# Patient Record
Sex: Male | Born: 1945 | Race: White | Hispanic: No | Marital: Married | Smoking: Former smoker
Health system: Southern US, Community
[De-identification: ages and names within clinical notes are randomized; demographics above are authoritative.]

## PROBLEM LIST (undated history)

## (undated) DIAGNOSIS — M1990 Unspecified osteoarthritis, unspecified site: Secondary | ICD-10-CM

## (undated) DIAGNOSIS — C61 Malignant neoplasm of prostate: Secondary | ICD-10-CM

## (undated) DIAGNOSIS — I10 Essential (primary) hypertension: Secondary | ICD-10-CM

## (undated) DIAGNOSIS — N400 Enlarged prostate without lower urinary tract symptoms: Secondary | ICD-10-CM

## (undated) DIAGNOSIS — E119 Type 2 diabetes mellitus without complications: Secondary | ICD-10-CM

## (undated) DIAGNOSIS — N410 Acute prostatitis: Secondary | ICD-10-CM

---

## 1980-05-27 HISTORY — PX: OTHER SURGICAL HISTORY: SHX169

## 2015-05-28 DIAGNOSIS — M1990 Unspecified osteoarthritis, unspecified site: Secondary | ICD-10-CM

## 2015-05-28 DIAGNOSIS — N400 Enlarged prostate without lower urinary tract symptoms: Secondary | ICD-10-CM

## 2015-05-28 HISTORY — DX: Unspecified osteoarthritis, unspecified site: M19.90

## 2015-05-28 HISTORY — DX: Benign prostatic hyperplasia without lower urinary tract symptoms: N40.0

## 2015-08-26 DIAGNOSIS — N410 Acute prostatitis: Secondary | ICD-10-CM

## 2015-08-26 HISTORY — DX: Acute prostatitis: N41.0

## 2016-01-05 ENCOUNTER — Other Ambulatory Visit: Payer: Self-pay | Admitting: Urology

## 2016-02-20 HISTORY — PX: PROSTATE BIOPSY: SHX241

## 2016-02-23 ENCOUNTER — Encounter (HOSPITAL_BASED_OUTPATIENT_CLINIC_OR_DEPARTMENT_OTHER): Payer: Self-pay | Admitting: *Deleted

## 2016-02-23 NOTE — Progress Notes (Signed)
To [facility] at 0600- Istat,Ekg on arrival-instructed Npo after [place]-will take lansoprazole,amlodipine,metoprolol with small water in am.

## 2016-02-29 ENCOUNTER — Ambulatory Visit (HOSPITAL_BASED_OUTPATIENT_CLINIC_OR_DEPARTMENT_OTHER): Admission: RE | Admit: 2016-02-29 | Payer: Self-pay | Source: Ambulatory Visit | Admitting: Urology

## 2016-02-29 HISTORY — DX: Acute prostatitis: N41.0

## 2016-02-29 HISTORY — DX: Benign prostatic hyperplasia without lower urinary tract symptoms: N40.0

## 2016-02-29 HISTORY — DX: Unspecified osteoarthritis, unspecified site: M19.90

## 2016-02-29 HISTORY — DX: Essential (primary) hypertension: I10

## 2016-02-29 SURGERY — CYSTOSCOPY WITH INSERTION OF UROLIFT
Anesthesia: General

## 2016-05-27 HISTORY — PX: HIP ARTHROPLASTY: SHX981

## 2017-05-27 HISTORY — PX: KNEE ARTHROPLASTY: SHX992

## 2017-07-10 ENCOUNTER — Encounter: Payer: Self-pay | Admitting: Radiation Oncology

## 2017-07-10 ENCOUNTER — Other Ambulatory Visit (HOSPITAL_COMMUNITY): Payer: Self-pay | Admitting: Urology

## 2017-07-10 DIAGNOSIS — C61 Malignant neoplasm of prostate: Secondary | ICD-10-CM

## 2017-07-31 ENCOUNTER — Encounter (HOSPITAL_COMMUNITY)
Admission: RE | Admit: 2017-07-31 | Discharge: 2017-07-31 | Disposition: A | Discharge status: Home / Self Care | Payer: Medicare Other | Source: Ambulatory Visit | Attending: Urology | Admitting: Urology

## 2017-07-31 DIAGNOSIS — R9389 Abnormal findings on diagnostic imaging of other specified body structures: Secondary | ICD-10-CM | POA: Insufficient documentation

## 2017-07-31 DIAGNOSIS — C61 Malignant neoplasm of prostate: Secondary | ICD-10-CM | POA: Insufficient documentation

## 2017-07-31 DIAGNOSIS — R937 Abnormal findings on diagnostic imaging of other parts of musculoskeletal system: Secondary | ICD-10-CM | POA: Insufficient documentation

## 2017-07-31 MED ORDER — TECHNETIUM TC 99M MEDRONATE IV KIT
22.0000 | PACK | Freq: Once | INTRAVENOUS | Status: AC | PRN
  Administered 2017-07-31: 22 via INTRAVENOUS

## 2017-08-01 ENCOUNTER — Encounter: Payer: Self-pay | Admitting: Radiation Oncology

## 2017-08-01 NOTE — Progress Notes (Signed)
GU Location of Tumor / Histology: prostatic adenocarcinoma  If Prostate Cancer, Gleason Score is (3 + 4) and PSA is (9.47) on 05/2017. Prostate volume: 57 grams.  Alex Randolph was diagnosed on 02/21/2016 with prostate cancer. He has been under active surveillance since.  Repeat prostate biopsy:   Past/Anticipated interventions by urology, if any: prostate biopsy x 3, active surveillance, bone scan (negative), ordered CT abdomen/pelvis (can't find where this was done), referral to radiation oncology  Past/Anticipated interventions by medical oncology, if any: no  Weight changes, if any: no  Bowel/Bladder complaints, if any: IPSS 19. Reports occasional urinary leakage associated with urgency. Reports he has to strain at night to void. Reports taking tamsulosin once daily. Reports a strong stream without difficulty emptying his bladder. Reports nocturia x 3-4. Denies dysuria or hematuria.   Nausea/Vomiting, if any: no  Pain issues, if any:  Denies pain in left hip at rest. Reports pain in left hip with ambulation. Planning hip surgery in May.  SAFETY ISSUES:  Prior radiation? no  Pacemaker/ICD? no  Possible current pregnancy? no  Is the patient on methotrexate? no  Current Complaints / other details:  72 year old male. Married. Retired. Resides in [place], [place] (45 minutes away).

## 2017-08-04 ENCOUNTER — Ambulatory Visit
Admission: RE | Admit: 2017-08-04 | Discharge: 2017-08-04 | Disposition: A | Discharge status: Home / Self Care | Payer: Medicare Other | Source: Ambulatory Visit | Attending: Radiation Oncology | Admitting: Radiation Oncology

## 2017-08-04 ENCOUNTER — Encounter: Payer: Self-pay | Admitting: Medical Oncology

## 2017-08-04 ENCOUNTER — Other Ambulatory Visit: Payer: Self-pay

## 2017-08-04 ENCOUNTER — Encounter: Payer: Self-pay | Admitting: Radiation Oncology

## 2017-08-04 VITALS — BP 129/62 | HR 66 | Temp 98.3°F | Resp 18 | Ht 71.0 in | Wt 238.2 lb

## 2017-08-04 DIAGNOSIS — Z803 Family history of malignant neoplasm of breast: Secondary | ICD-10-CM | POA: Insufficient documentation

## 2017-08-04 DIAGNOSIS — I10 Essential (primary) hypertension: Secondary | ICD-10-CM | POA: Insufficient documentation

## 2017-08-04 DIAGNOSIS — Z87891 Personal history of nicotine dependence: Secondary | ICD-10-CM | POA: Diagnosis not present

## 2017-08-04 DIAGNOSIS — Z9889 Other specified postprocedural states: Secondary | ICD-10-CM | POA: Insufficient documentation

## 2017-08-04 DIAGNOSIS — Z7984 Long term (current) use of oral hypoglycemic drugs: Secondary | ICD-10-CM | POA: Diagnosis not present

## 2017-08-04 DIAGNOSIS — Z808 Family history of malignant neoplasm of other organs or systems: Secondary | ICD-10-CM | POA: Diagnosis not present

## 2017-08-04 DIAGNOSIS — C61 Malignant neoplasm of prostate: Secondary | ICD-10-CM | POA: Insufficient documentation

## 2017-08-04 DIAGNOSIS — Z880 Allergy status to penicillin: Secondary | ICD-10-CM | POA: Diagnosis not present

## 2017-08-04 DIAGNOSIS — Z79899 Other long term (current) drug therapy: Secondary | ICD-10-CM | POA: Insufficient documentation

## 2017-08-04 HISTORY — DX: Malignant neoplasm of prostate: C61

## 2017-08-04 NOTE — Progress Notes (Signed)
See progress note under physician encounter. 

## 2017-08-04 NOTE — Progress Notes (Signed)
Radiation Oncology         [phone] ________________________________  Initial Outpatient Consultation  Name: Alex Randolph MRN: [id number]  Date: 08/04/2017  DOB: [birth year]  CC:Macon Large, MD  Davis Gourd*   REFERRING PHYSICIAN: Davis Gourd*  DIAGNOSIS:  72 y.o. gentleman with Stage T1c adenocarcinoma of the prostate with Gleason Score of 3+4, and PSA of 9.47    ICD-10-CM   1. Malignant neoplasm of prostate ([place]) C61     HISTORY OF PRESENT ILLNESS: Alex Randolph is a 72 y.o. male with a diagnosis of prostate cancer, originally diagnosed on 02/21/2016 with Gleason 6 in 2/12 cores and a PSA of 6.4. He has been under active surveillance since. 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 More recently, he was noted to have a PSA of 9.47 in January 2019, up significantly from 5.4 in August 2018.  Accordingly, he was referred for evaluation in urology by Dr. Lovena Neighbours on 07/03/2017, where a digital rectal examination was performed at that time revealing no prostate nodularity. The patient proceeded to transrectal ultrasound with 12 biopsies of the prostate on 07/03/2017.  The prostate volume measured 57 cc.  Out of 12 core biopsies, 4 were positive.  The maximum Gleason score was 3+4, and this was seen in the left mid.  Repeat prostate biopsy:     The patient reviewed the biopsy results with his urologist and he has kindly been referred today for discussion of potential radiation treatment options. He underwent CT abdomen and pelvis and bone scan on 07/31/17 that did reveal concerns for disease.   PREVIOUS RADIATION THERAPY: No  PAST MEDICAL HISTORY:  Past Medical History:  Diagnosis Date  . Arthritis 2017   knees,hips,thumbs  . BPH (benign prostatic hyperplasia) 2017  . Hypertension    controlled with medication  . Prostate cancer ([place])   . Prostatitis, acute 08/2015   in hospital w/fever-diagnosis made in [place]: Past Surgical  History:  Procedure Laterality Date  . bone spur removal  1982  . PROSTATE BIOPSY  02/20/2016   in urologist office along with cystoscopy    FAMILY HISTORY:  Family History  Problem Relation Age of Onset  . Cancer Sister        breast  . Cancer Maternal Grandfather        prostate cancer ?  Marland Kitchen Cancer Daughter        hodgkins    SOCIAL HISTORY:  Social History   Socioeconomic History  . Marital status: Married    Spouse name: Not on file  . Number of children: 1  . Years of education: Not on file  . Highest education level: Not on file  Social Needs  . Financial resource strain: Not on file  . Food insecurity - worry: Not on file  . Food insecurity - inability: Not on file  . Transportation needs - medical: Not on file  . Transportation needs - non-medical: Not on file  Occupational History    Comment: retired  Tobacco Use  . Smoking status: Former Smoker    Packs/day: 1.00    Types: Cigarettes    Last attempt to quit: 02/23/1976    Years since quitting: 41.4  . Smokeless tobacco: Never Used  Substance and Sexual Activity  . Alcohol use: Yes    Alcohol/week: 1.2 oz    Types: 2 Cans of beer per week  . Drug use: No  . Sexual activity:  Yes  Other Topics Concern  . Not on file  Social History Narrative   Resides in [place], [place] 45 minutes away  The patient is married and lives in [place], [place]. He is retired.  ALLERGIES: Penicillins  MEDICATIONS:  Current Outpatient Medications  Medication Sig Dispense Refill  . amLODipine (NORVASC) 5 MG tablet Take 5 mg by mouth daily.    Marland Kitchen atorvastatin (LIPITOR) 10 MG tablet Take 10 mg by mouth daily.    . lansoprazole (PREVACID) 30 MG capsule Take 30 mg by mouth daily at 12 noon.    Marland Kitchen losartan-hydrochlorothiazide (HYZAAR) 100-12.5 MG tablet Take 1 tablet by mouth daily.    . metFORMIN (GLUCOPHAGE) 500 MG tablet Take by mouth 2 (two) times daily with a meal.    . metoprolol tartrate (LOPRESSOR) 25 MG tablet Take 25 mg by  mouth daily.    . pregabalin (LYRICA) 100 MG capsule Take 100 mg by mouth 2 (two) times daily.    . tamsulosin (FLOMAX) 0.4 MG CAPS capsule Take 0.4 mg by mouth daily after supper.    . traMADol (ULTRAM) 50 MG tablet Take by mouth every 6 (six) hours as needed.     No current facility-administered medications for this encounter.     REVIEW OF SYSTEMS:  On review of systems, the patient reports that he is doing well overall. He denies any chest pain, shortness of breath, cough, fevers, chills, night sweats, or unintended weight changes. He denies any bowel disturbances, and denies abdominal pain, nausea or vomiting. He reports severe left hip pain with ambulation but denies pain in left hip at rest. His IPSS was 19, indicating moderate urinary symptoms of urgency with occasional leakage, nocturia x4 with straining, frequency, intermittency, and weak stream. He is managing his urinary symptoms with tamsulosin. He denies any dysuria or hematuria. He is able to complete sexual activity with all attempts. A complete review of systems is obtained and is otherwise negative.    PHYSICAL EXAM:  Wt Readings from Last 3 Encounters:  08/04/17 238 lb 3.2 oz (108 kg)   Temp Readings from Last 3 Encounters:  08/04/17 98.3 F (36.8 C) (Oral)   BP Readings from Last 3 Encounters:  08/04/17 129/62   Pulse Readings from Last 3 Encounters:  08/04/17 66   Pain Assessment Pain Score: 0-No pain/10  In general this is a well appearing caucasian male in no acute distress. He is alert and oriented x4 and appropriate throughout the examination. HEENT reveals that the patient is normocephalic, atraumatic. EOMs are intact. Skin is intact without any evidence of gross lesions.Cardiopulmonary assessment is negative for acute distress and he exhibits normal effort.   KPS = 100  100 - Normal; no complaints; no evidence of disease. 90   - Able to carry on normal activity; minor signs or symptoms of disease. 80   -  Normal activity with effort; some signs or symptoms of disease. 68   - Cares for self; unable to carry on normal activity or to do active work. 60   - Requires occasional assistance, but is able to care for most of his personal needs. 50   - Requires considerable assistance and frequent medical care. 91   - Disabled; requires special care and assistance. 18   - Severely disabled; hospital admission is indicated although death not imminent. 60   - Very sick; hospital admission necessary; active supportive treatment necessary. 10   - Moribund; fatal processes progressing rapidly. 0     -  Dead  Karnofsky DA, Abelmann WH, Craver LS and Burchenal [facility] [phone]) The use of the nitrogen mustards in the palliative treatment of carcinoma: with particular reference to bronchogenic carcinoma Cancer 1 634-56  LABORATORY DATA:  No results found for: WBC, HGB, HCT, MCV, PLT No results found for: NA, K, CL, CO2 No results found for: ALT, AST, GGT, ALKPHOS, BILITOT   RADIOGRAPHY: Nm Bone Scan Whole Body  Result Date: 07/31/2017 CLINICAL DATA:  72 year old with current history of prostate cancer with elevated PSA of 9.47 (06/09/2017). Patient complains of severe left hip pain though states he has arthritis. No prior trauma. EXAM: NUCLEAR MEDICINE WHOLE BODY BONE SCAN TECHNIQUE: Whole body anterior and posterior images were obtained approximately 3 hours after intravenous injection of radiopharmaceutical. RADIOPHARMACEUTICALS:  22 mCi Technetium-44m MDP IV COMPARISON:  None. FINDINGS: Marked increased activity in the left acetabulum and in the left femoral head. Scattered increased activity involving the thoracic and lumbar spine. Increased activity involving the medial compartments of both knees, involving both the femur and tibia, right greater than left. Increased activity localizing to the right midfoot and to the first MTP joints bilaterally. Marked activity in the anterior neck which are presumably is related to  thyroid uptake of free pertechnetate. The thyroid gland therefore is likely enlarged. Physiologic excretion into the urinary tract. IMPRESSION: 1. 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 2. Likely degenerative activity involving the thoracic and lumbar spine. 3. Probable thyroid gland enlargement, as there is a large amount of activity in the anterior neck due to thyroid uptake of free pertechnetate. RECOMMENDATION: If not already performed elsewhere, x-rays of the left hip, both knees, and the thoracic and lumbar spine is recommended. Electronically Signed   By: Evangeline Dakin M.D.   On: 07/31/2017 17:35      IMPRESSION/PLAN: 1. 72 y.o. gentleman with Stage T1c adenocarcinoma of the prostate with Gleason Score of 3+4, and PSA of 9.47. We reviewed the patient's history and course. We discussed his T staging, Gleason score, and PSA as to how this determines treatment options. We reviewed that he is a candidate for external beam radiotherapy versus seed implant approach. However after reviewing his IPSS score, his current need for Flomax, and his gland volume put him at high risk of outflow urinary obstruction. He discusses that there are also risks of needing intermittent catheterizations. We discussed the risks, benefits, short, and long term effects of radiotherapy, and the patient is interested in proceeding in external beam radiotherapy closer to home in [place], [place]. We discussed the delivery and logistics of radiotherapy and anticipates a course of 8 weeks of treatment if he proceeds with external beam treatment. He is interested in this and we will coordinate referral to Radiation Oncology in [place], [place].  In a visit lasting 45 minutes, greater than 50% of the time was spent face to face discussing his case, and coordinating the patient's care.     Carola Rhine,  PAC    Tyler Pita, MD  [facility] Direct Dial: [phone]  Fax: [phone] [email or web address]  Skype  LinkedIn   This document serves as a record of services personally performed by Tyler Pita, MD and Shona Simpson, PA-C. It was created on their behalf by Rae Lips, a trained medical scribe. The creation of this record is based on the scribe's personal observations and the providers' statements to them.  This document has been checked and approved by the attending providers.

## 2017-08-04 NOTE — Progress Notes (Signed)
Introduced myself to Alex Randolph and his wife as the prostate nurse navigator and my role. He is interested in seed implant but we discussed that the prostate gland size and his urinary score will be factors in this treatment choice. He lives in [place] so if he does 8 weeks of radiation he would like to get treated closer to home. I informed him that Dr. Tammi Klippel can refer him. I gave them my business card and asked they call with questions and or concerns.

## 2017-08-07 ENCOUNTER — Telehealth: Payer: Self-pay | Admitting: *Deleted

## 2017-08-07 NOTE — Telephone Encounter (Signed)
CALLED PATIENT TO INFORM OF APPT. WITH DR. Kristen Loader, [place]. ON 08-26-17 - ARRIVAL TIME - 10 AM , [address], [address] - [phone], SPOKE WITH PATIENT AND HE IS AWARE OF THIS APPT.

## 2018-10-22 IMAGING — NM NM BONE WHOLE BODY
2 series · 2 of 2 positions shown · non-contrast
Comparison: None.

CLINICAL DATA: 71-year-old with current history of prostate cancer
with elevated PSA of 9.47 (06/09/2017). Patient complains of severe
left hip pain though states he has arthritis. No prior trauma.

EXAM:
NUCLEAR MEDICINE WHOLE BODY BONE SCAN
TECHNIQUE: Whole body anterior and posterior images were obtained approximately
3 hours after intravenous injection of radiopharmaceutical.
RADIOPHARMACEUTICALS:  22 mCi Fechnetium-66m MDP IV

[Series 1: wbr_bone_40 whole body · 2.66mm/px · 1 of 1 slices shown (1 of 2)]
[im 1/1]
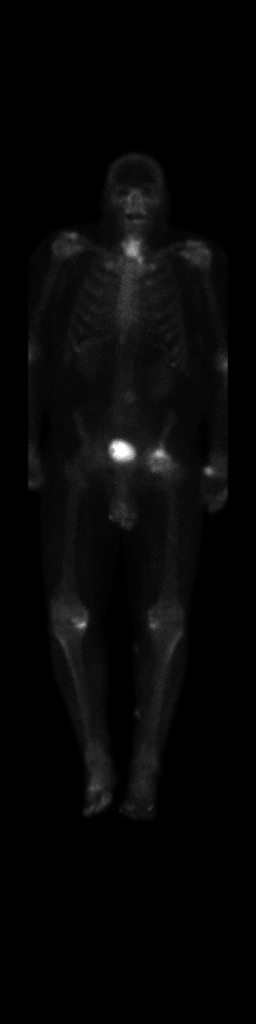

[Series 1: wbr_bone_40 whole body · 2.66mm/px · 1 of 1 slices shown (2 of 2)]
[im 1/1]
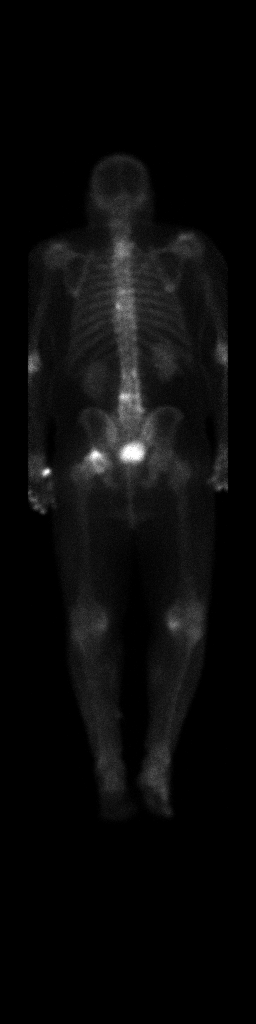

[2 of 2 positions shown; findings below may reference images not displayed]

FINDINGS: Marked increased activity in the left acetabulum and in the left
femoral head. Scattered increased activity involving the thoracic
and lumbar spine. Increased activity involving the medial
compartments of both knees, involving both the femur and tibia,
right greater than left. Increased activity localizing to the right
midfoot and to the first MTP joints bilaterally.

Marked activity in the anterior neck which are presumably is related
to thyroid uptake of free pertechnetate. The thyroid gland therefore
is likely enlarged. Physiologic excretion into the urinary tract.
IMPRESSION: 1. Findings which most likely indicate osteoarthritis involving the
left hip, the medial compartments of both knees, the right midfoot,
and the first MTP joints bilaterally.
2. Likely degenerative activity involving the thoracic and lumbar
spine.
3. Probable thyroid gland enlargement, as there is a large amount of
activity in the anterior neck due to thyroid uptake of free
pertechnetate.

RECOMMENDATION:

If not already performed elsewhere, x-rays of the left hip, both
knees, and the thoracic and lumbar spine is recommended.

## 2019-04-19 ENCOUNTER — Encounter: Payer: Self-pay | Admitting: *Deleted

## 2022-07-03 NOTE — Patient Instructions (Addendum)
DUE TO COVID-19 ONLY TWO VISITORS  (aged 78 and older)  ARE ALLOWED TO COME WITH YOU AND STAY IN THE WAITING ROOM ONLY DURING PRE OP AND PROCEDURE.   **NO VISITORS ARE ALLOWED IN THE SHORT STAY AREA OR RECOVERY ROOM!!**  IF YOU WILL BE ADMITTED INTO THE HOSPITAL YOU ARE ALLOWED ONLY FOUR SUPPORT PEOPLE DURING VISITATION HOURS ONLY (7 AM -8PM)   The support person(s) must pass our screening, gel in and out, and wear a mask at all times, including in the patient's room. Patients must also wear a mask when staff or their support person are in the room. Visitors GUEST BADGE MUST BE WORN VISIBLY  One adult visitor may remain with you overnight and MUST be in the room by 8 P.M.     Your procedure is scheduled on: 07/15/22   Report to [facility] Main Entrance    Report to admitting at 12:15 PM   Call this number if you have problems the morning of surgery [phone]   Do not eat food :After Midnight.   After Midnight you may have the following liquids until _11:45_AM/  DAY OF SURGERY  Water Black Coffee (sugar ok, NO MILK/CREAM OR CREAMERS)  Tea (sugar ok, NO MILK/CREAM OR CREAMERS) regular and decaf                             Plain Jell-O (NO RED)                                           Fruit ices (not with fruit pulp, NO RED)                                     Popsicles (NO RED)                                                                  Juice: apple, WHITE grape, WHITE cranberry Sports drinks like Gatorade (NO RED)                    The day of surgery:  Drink ONE (1) G2 at  11:15 AM the morning of surgery. Drink in one sitting. Do not sip.  This drink was given to you during your hospital  pre-op appointment visit. Nothing else to drink after completing the   G2. At 11:45 AM          If you have questions, please contact your surgeon's office.       Oral Hygiene is also important to reduce your risk of infection.                                     Remember - BRUSH YOUR TEETH THE MORNING OF SURGERY WITH YOUR REGULAR TOOTHPASTE  DENTURES WILL BE REMOVED PRIOR TO SURGERY PLEASE DO NOT APPLY "Poly grip" OR ADHESIVES!!!   Do NOT smoke after Midnight   Take these medicines  the morning of surgery with A SIP OF WATER:  Lyrica                                                                                                                            Atorvastatin                                                                                                                            Metoprolol                                                                                                                            Amlodipine                                                                                                                            Tamsulosin  Iansoprazole-Pepcid  DO NOT TAKE ANY ORAL DIABETIC MEDICATIONS DAY OF YOUR SURGERY (Metformin)  Bring CPAP mask and tubing day of surgery.                              You may not have any metal on your body including , jewelry, and body piercing             Do not wear  lotions, powders, cologne, or deodorant              Men may shave face and neck.   Do not bring valuables to the hospital. [place].   Contacts, glasses, or bridgework may not be worn into surgery.   Bring small overnight bag day of surgery.   DO NOT [place]. PHARMACY WILL DISPENSE MEDICATIONS LISTED ON YOUR MEDICATION LIST TO YOU DURING YOUR ADMISSION [facility]!       Special Instructions: Bring a copy of your healthcare power of attorney and living will documents  the day of surgery if you haven't scanned them before.              Please read over the following fact sheets you were given: IF YOU  HAVE QUESTIONS ABOUT YOUR PRE-OP INSTRUCTIONS PLEASE CALL [phone]    [facility] - Preparing for Surgery Before surgery, you can play an important role.  Because skin is not sterile, your skin needs to be as free of germs as possible.  You can reduce the number of germs on your skin by washing with CHG (chlorahexidine gluconate) soap before surgery.  CHG is an antiseptic cleaner which kills germs and bonds with the skin to continue killing germs even after washing. Please DO NOT use if you have an allergy to CHG or antibacterial soaps.  If your skin becomes reddened/irritated stop using the CHG and inform your nurse when you arrive at Short Stay.  You may shave your face/neck. Please follow these instructions carefully:  1.  Shower with CHG Soap the night before surgery and the  morning of Surgery.  2.  If you choose to wash your hair, wash your hair first as usual with your  normal  shampoo.  3.  After you shampoo, rinse your hair and body thoroughly to remove the  shampoo.                            4.  Use CHG as you would any other liquid soap.  You can apply chg directly  to the skin and wash                       Gently with a scrungie or clean washcloth.  5.  Apply the CHG Soap to your body ONLY FROM THE NECK DOWN.   Do not use on face/ open                           Wound or open sores. Avoid contact with eyes, ears mouth and genitals (private parts).                       Wash face,  Genitals (private parts) with  your normal soap.             6.  Wash thoroughly, paying special attention to the area where your surgery  will be performed.  7.  Thoroughly rinse your body with warm water from the neck down.  8.  DO NOT shower/wash with your normal soap after using and rinsing off  the CHG Soap.                9.  Pat yourself dry with a clean towel.            10.  Wear clean pajamas.            11.  Place clean sheets on your bed the night of your first shower and do not  sleep with  pets. Day of Surgery : Do not apply any lotions/deodorants the morning of surgery.  Please wear clean clothes to the hospital/surgery center.  FAILURE TO FOLLOW THESE INSTRUCTIONS MAY RESULT IN THE CANCELLATION OF YOUR SURGERY  ________________________________________________________________________  Incentive Spirometer  An incentive spirometer is a tool that can help keep your lungs clear and active. This tool measures how well you are filling your lungs with each breath. Taking long deep breaths may help reverse or decrease the chance of developing breathing (pulmonary) problems (especially infection) following: A long period of time when you are unable to move or be active. BEFORE THE PROCEDURE  If the spirometer includes an indicator to show your best effort, your nurse or respiratory therapist will set it to a desired goal. If possible, sit up straight or lean slightly forward. Try not to slouch. Hold the incentive spirometer in an upright position. INSTRUCTIONS FOR USE  Sit on the edge of your bed if possible, or sit up as far as you can in bed or on a chair. Hold the incentive spirometer in an upright position. Breathe out normally. Place the mouthpiece in your mouth and seal your lips tightly around it. Breathe in slowly and as deeply as possible, raising the piston or the ball toward the top of the column. Hold your breath for 3-5 seconds or for as long as possible. Allow the piston or ball to fall to the bottom of the column. Remove the mouthpiece from your mouth and breathe out normally. Rest for a few seconds and repeat Steps 1 through 7 at least 10 times every 1-2 hours when you are awake. Take your time and take a few normal breaths between deep breaths. The spirometer may include an indicator to show your best effort. Use the indicator as a goal to work toward during each repetition. After each set of 10 deep breaths, practice coughing to be sure your lungs are clear. If you  have an incision (the cut made at the time of surgery), support your incision when coughing by placing a pillow or rolled up towels firmly against it. Once you are able to get out of bed, walk around indoors and cough well. You may stop using the incentive spirometer when instructed by your caregiver.  RISKS AND COMPLICATIONS Take your time so you do not get dizzy or light-headed. If you are in pain, you may need to take or ask for pain medication before doing incentive spirometry. It is harder to take a deep breath if you are having pain. AFTER USE Rest and breathe slowly and easily. It can be helpful to keep track of a log of your progress. Your caregiver can provide you with a  simple table to help with this. If you are using the spirometer at home, follow these instructions: [place] IF:  You are having difficultly using the spirometer. You have trouble using the spirometer as often as instructed. Your pain medication is not giving enough relief while using the spirometer. You develop fever of 100.5 F (38.1 C) or higher. SEEK IMMEDIATE MEDICAL CARE IF:  You cough up bloody sputum that had not been present before. You develop fever of 102 F (38.9 C) or greater. You develop worsening pain at or near the incision site. MAKE SURE YOU:  Understand these instructions. Will watch your condition. Will get help right away if you are not doing well or get worse. Document Released: 09/23/2006 Document Revised: 08/05/2011 Document Reviewed: 11/24/2006 [facility] Patient Information 2014 [place], [place].   ________________________________________________________________________

## 2022-07-04 ENCOUNTER — Encounter (HOSPITAL_COMMUNITY)
Admission: RE | Admit: 2022-07-04 | Discharge: 2022-07-04 | Disposition: A | Discharge status: Home / Self Care | Payer: Medicare Other | Source: Ambulatory Visit | Attending: Orthopedic Surgery | Admitting: Orthopedic Surgery

## 2022-07-04 ENCOUNTER — Encounter (HOSPITAL_COMMUNITY): Payer: Self-pay

## 2022-07-04 ENCOUNTER — Other Ambulatory Visit: Payer: Self-pay

## 2022-07-04 DIAGNOSIS — Z01818 Encounter for other preprocedural examination: Secondary | ICD-10-CM | POA: Diagnosis present

## 2022-07-04 DIAGNOSIS — E119 Type 2 diabetes mellitus without complications: Secondary | ICD-10-CM | POA: Insufficient documentation

## 2022-07-04 HISTORY — DX: Type 2 diabetes mellitus without complications: E11.9

## 2022-07-04 LAB — CBC
HCT: 45.9 % (ref 39.0–52.0)
Hemoglobin: 15.8 g/dL (ref 13.0–17.0)
MCH: 33 pg (ref 26.0–34.0)
MCHC: 34.4 g/dL (ref 30.0–36.0)
MCV: 95.8 fL (ref 80.0–100.0)
Platelets: 145 10*3/uL — ABNORMAL LOW (ref 150–400)
RBC: 4.79 MIL/uL (ref 4.22–5.81)
RDW: 12.7 % (ref 11.5–15.5)
WBC: 7.8 10*3/uL (ref 4.0–10.5)
nRBC: 0 % (ref 0.0–0.2)

## 2022-07-04 LAB — TYPE AND SCREEN
ABO/RH(D): O POS
Antibody Screen: NEGATIVE

## 2022-07-04 LAB — BASIC METABOLIC PANEL
Anion gap: 12 (ref 5–15)
BUN: 12 mg/dL (ref 8–23)
CO2: 26 mmol/L (ref 22–32)
Calcium: 9.4 mg/dL (ref 8.9–10.3)
Chloride: 98 mmol/L (ref 98–111)
Creatinine, Ser: 0.78 mg/dL (ref 0.61–1.24)
GFR, Estimated: >60 mL/min (ref >60)
Glucose, Bld: 136 mg/dL — ABNORMAL HIGH (ref 70–99)
Potassium: 4.3 mmol/L (ref 3.5–5.1)
Sodium: 136 mmol/L (ref 135–145)

## 2022-07-04 LAB — HEMOGLOBIN A1C
Hgb A1c MFr Bld: 6.2 % — ABNORMAL HIGH (ref 4.8–5.6)
Mean Plasma Glucose: 131.24 mg/dL

## 2022-07-04 LAB — SURGICAL PCR SCREEN
MRSA, PCR: NEGATIVE
Staphylococcus aureus: NEGATIVE

## 2022-07-04 LAB — GLUCOSE, CAPILLARY: Glucose-Capillary: 147 mg/dL — ABNORMAL HIGH (ref 70–99)

## 2022-07-04 NOTE — Progress Notes (Signed)
Anesthesia note:  Bowel prep reminder:  NA  PCP - Dr. Skip [facility] Cardiologist -none Other-   Chest x-ray - no EKG - 07/04/22 Stress Test - no ECHO - no Cardiac Cath - no CABG-no Pacemaker/ICD device last checked:no  Sleep Study -  CPAP -    CBG at PAT visit-147 Fasting Blood Sugar at home-none Checks Blood Sugar doesn't test_____  Blood Thinner:no Blood Thinner Instructions: Aspirin Instructions: Last Dose:  Anesthesia review: /No    Patient denies shortness of breath, fever, cough and chest pain at PAT appointment. Pt has no SOB with activities. He is using crutches.   Patient verbalized understanding of instructions that were given to them at the PAT appointment. Patient was also instructed that they will need to review over the PAT instructions again at home before surgery.yes his wife was with him

## 2022-07-08 NOTE — H&P (Signed)
TOTAL HIP ADMISSION H&P  Patient is admitted for right total hip arthroplasty.  Subjective:  Chief Complaint: Right hip pain  HPI: Alex Randolph, 77 y.o. male, has a history of pain and functional disability in the right hip due to arthritis and patient has failed non-surgical conservative treatments for greater than 12 weeks to include NSAID's and/or analgesics, corticosteriod injections, use of assistive devices, and activity modification. Onset of symptoms was gradual, starting  several  years ago with gradually worsening course since that time. The patient noted no past surgery on the right hip. Patient currently rates pain in the right hip at 7 out of 10 with activity. Patient has night pain, worsening of pain with activity and weight bearing, and trendelenberg gait. Patient has evidence of  bone-on-bone arthritis in the right hip  by imaging studies. This condition presents safety issues increasing the risk of falls. There is no current active infection.  Patient Active Problem List   Diagnosis Date Noted   Malignant neoplasm of prostate ([place]) 08/04/2017    Past Medical History:  Diagnosis Date   Arthritis 2017   knees,hips,thumbs, neck   BPH (benign prostatic hyperplasia) 2017   radiation   Diabetes mellitus without complication ([place])    Hypertension    controlled with medication   Prostate cancer ([place])    Prostatitis, acute 08/2015   in hospital w/fever-diagnosis made in [place]    Past Surgical History:  Procedure Laterality Date   bone spur removal  1982   neck   HIP ARTHROPLASTY Left 2018   KNEE ARTHROPLASTY Right 2019   PROSTATE BIOPSY  02/20/2016   in urologist office along with cystoscopy    Prior to Admission medications   Medication Sig Start Date End Date Taking? Authorizing Provider  amLODipine (NORVASC) 5 MG tablet Take 5 mg by mouth daily.   Yes [provider]  atorvastatin (LIPITOR) 10 MG tablet Take 10 mg by mouth daily.   Yes [provider]  lansoprazole (PREVACID) 30 MG capsule Take 30 mg by mouth daily at 12 noon.   Yes [provider]  losartan-hydrochlorothiazide (HYZAAR) 100-12.5 MG tablet Take 1 tablet by mouth daily.   Yes [provider]  metFORMIN (GLUCOPHAGE-XR) 500 MG 24 hr tablet Take 500 mg by mouth daily. 02/21/22  Yes [provider]  metoprolol tartrate (LOPRESSOR) 50 MG tablet Take 50 mg by mouth 2 (two) times daily.   Yes [provider]  pregabalin (LYRICA) 100 MG capsule Take 100 mg by mouth 2 (two) times daily.   Yes [provider]  tamsulosin (FLOMAX) 0.4 MG CAPS capsule Take 0.4 mg by mouth daily after supper.   Yes [provider]  triamcinolone (KENALOG) 0.025 % ointment Apply 1 Application topically daily as needed (rash). 02/03/22  Yes [provider]    Allergies  Allergen Reactions   Penicillins Hives    Social History   Socioeconomic History   Marital status: Married    Spouse name: Not on file   Number of children: 1   Years of education: Not on file   Highest education level: Not on file  Occupational History    Comment: retired  Tobacco Use   Smoking status: Former    Packs/day: 1.00    Years: 25.00    Total pack years: 25.00    Types: Cigarettes    Quit date: 02/23/1976    Years since quitting: 46.4   Smokeless tobacco: Never  Vaping Use   Vaping  Use: Never used  Substance and Sexual Activity   Alcohol use: Yes    Alcohol/week: 2.0 standard drinks of alcohol    Types: 2 Cans of beer per week   Drug use: No   Sexual activity: Yes  Other Topics Concern   Not on file  Social History Narrative   Resides in [place], [place] 45 minutes away   Social Determinants of Health   Financial Resource Strain: Not on file  Food Insecurity: Not on file  Transportation Needs: Not on file  Physical Activity: Not on file  Stress: Not on file  Social Connections: Not on file  Intimate Partner Violence: Not on file     Tobacco Use: Medium Risk (07/04/2022)   Patient History    Smoking Tobacco Use: Former    Smokeless Tobacco Use: Never    Passive Exposure: Not on file   Social History   Substance and Sexual Activity  Alcohol Use Yes   Alcohol/week: 2.0 standard drinks of alcohol   Types: 2 Cans of beer per week    Family History  Problem Relation Age of Onset   Cancer Sister        breast   Cancer Maternal Grandfather        prostate cancer ?   Cancer Daughter        hodgkins    Review of Systems  Constitutional:  Negative for chills and fever.  HENT: Negative.    Eyes: Negative.   Respiratory:  Negative for cough and shortness of breath.   Cardiovascular:  Negative for chest pain and palpitations.  Gastrointestinal:  Negative for abdominal pain, constipation, diarrhea, nausea and vomiting.  Genitourinary:  Negative for dysuria, frequency and urgency.  Musculoskeletal:  Positive for joint pain.  Skin:  Negative for rash.   Objective:  Physical Exam: Well nourished and well developed.  General: Alert and oriented x3, cooperative and pleasant, no acute distress.  Head: normocephalic, atraumatic, neck supple.  Eyes: EOMI.  Abdomen: non-tender to palpation and soft, normoactive bowel sounds. Musculoskeletal: The patient has a significantly antalgic gait pattern favoring the right side without the use of crutches.   Right Hip Exam:  The range of motion: Flexion to 100 degrees, Internal Rotation to 10 degrees, External Rotation to 30 degrees, and abduction to 20 degrees with pain on range of motion.  There is no tenderness over the greater trochanteric bursa.   Left Hip Exam:  The range of motion: Flexion to 110 degrees, Internal Rotation to 20 degrees, External Rotation to 30 degrees, and abduction to 30 degrees without discomfort.  There is no tenderness over the greater trochanteric bursa.  Calves soft and nontender. Motor function intact in LE. Strength 5/5 LE  bilaterally. Neuro: Distal pulses 2+. Sensation to light touch intact in LE.  Vital signs in last 24 hours:    Imaging Review Plain radiographs demonstrate severe degenerative joint disease of the right hip. The bone quality appears to be adequate for age and reported activity level.  Assessment/Plan:  End stage arthritis, right hip  The patient history, physical examination, clinical judgement of the provider and imaging studies are consistent with end stage degenerative joint disease of the right hip and total hip arthroplasty is deemed medically necessary. The treatment options including medical management, injection therapy, arthroscopy and arthroplasty were discussed at length. The risks and benefits of total hip arthroplasty were presented and reviewed. The risks due to aseptic loosening, infection, stiffness, dislocation/subluxation, thromboembolic complications and other imponderables were discussed.  The patient acknowledged the explanation, agreed to proceed with the plan and consent was signed. Patient is being admitted for inpatient treatment for surgery, pain control, PT, OT, prophylactic antibiotics, VTE prophylaxis, progressive ambulation and ADLs and discharge planning.The patient is planning to be discharged  home .  Therapy Plans: HEP Disposition: Home with Wife Planned DVT Prophylaxis: Xarelto 10 mg (hx prostate cancer) DME Needed: RW PCP: Earney Mallet, MD (contacting for clearance) Cardiologist: Cleora Fleet, MD (clearance received) TXA: IV Allergies: penicillins (hives) Anesthesia Concerns: None BMI: 31.4 Last HgbA1c: pre-diabetic  Pharmacy: [facility], [place])  Other: -Patient given clearance form to take to PCP - appt scheduled Friday -Hx of hives after prolonged use of amoxicillin for dental abscess  - Patient was instructed on what medications to stop prior to surgery. - Follow-up visit in 2 weeks with Dr. Wynelle Link - Begin physical therapy following  surgery - Pre-operative lab work as pre-surgical testing - Prescriptions will be provided in hospital at time of discharge  R. Jaynie Bream, PA-C Orthopedic Surgery [facility] [place]

## 2022-07-14 NOTE — Anesthesia Preprocedure Evaluation (Signed)
Anesthesia Evaluation  Patient identified by MRN, date of birth, ID band Patient awake    Reviewed: Allergy & Precautions, NPO status , Patient's Chart, lab work & pertinent test results  Airway Mallampati: I  TM Distance: >3 FB Neck ROM: Full    Dental no notable dental hx. (+) Edentulous Upper, Dental Advisory Given   Pulmonary former smoker   Pulmonary exam normal breath sounds clear to auscultation       Cardiovascular hypertension, Pt. on medications and Pt. on home beta blockers Normal cardiovascular exam Rhythm:Regular Rate:Normal     Neuro/Psych    GI/Hepatic ,GERD  Medicated and Controlled,,  Endo/Other  diabetes, Type 2    Renal/GU Lab Results      Component                Value               Date                      CREATININE               0.78                07/04/2022                BUN                      12                  07/04/2022                NA                       136                 07/04/2022                K                        4.3                 07/04/2022                CL                       98                  07/04/2022                CO2                      26                  07/04/2022              Prostate CA    Musculoskeletal  (+) Arthritis ,    Abdominal   Peds  Hematology Lab Results      Component                Value               Date                      WBC  7.8                 07/04/2022                HGB                      15.8                07/04/2022                HCT                      45.9                07/04/2022                MCV                      95.8                07/04/2022                PLT                      145 (L)             07/04/2022              Anesthesia Other Findings All: PCN  Reproductive/Obstetrics                              Anesthesia Physical Anesthesia  Plan  ASA: 3  Anesthesia Plan: Spinal   Post-op Pain Management: Regional block* and Minimal or no pain anticipated   Induction:   PONV Risk Score and Plan: Treatment may vary due to age or medical condition and Propofol infusion  Airway Management Planned: Natural Airway and Nasal Cannula  Additional Equipment: None  Intra-op Plan:   Post-operative Plan:   Informed Consent: I have reviewed the patients History and Physical, chart, labs and discussed the procedure including the risks, benefits and alternatives for the proposed anesthesia with the patient or authorized representative who has indicated his/her understanding and acceptance.     Dental advisory given  Plan Discussed with: CRNA, Anesthesiologist and Surgeon  Anesthesia Plan Comments: (Spinal)        Anesthesia Quick Evaluation

## 2022-07-15 ENCOUNTER — Ambulatory Visit (HOSPITAL_COMMUNITY): Payer: Medicare Other | Admitting: Physician Assistant

## 2022-07-15 ENCOUNTER — Encounter (HOSPITAL_COMMUNITY): Payer: Self-pay | Admitting: Orthopedic Surgery

## 2022-07-15 ENCOUNTER — Other Ambulatory Visit: Payer: Self-pay

## 2022-07-15 ENCOUNTER — Ambulatory Visit (HOSPITAL_COMMUNITY): Payer: Medicare Other

## 2022-07-15 ENCOUNTER — Ambulatory Visit (HOSPITAL_BASED_OUTPATIENT_CLINIC_OR_DEPARTMENT_OTHER): Payer: Medicare Other | Admitting: Anesthesiology

## 2022-07-15 ENCOUNTER — Observation Stay (HOSPITAL_COMMUNITY): Payer: Medicare Other

## 2022-07-15 ENCOUNTER — Encounter (HOSPITAL_COMMUNITY)
Admission: RE | Disposition: A | Discharge status: Home / Self Care | Payer: Self-pay | Source: Ambulatory Visit | Attending: Orthopedic Surgery

## 2022-07-15 ENCOUNTER — Observation Stay (HOSPITAL_COMMUNITY)
Admission: RE | Admit: 2022-07-15 | Discharge: 2022-07-16 | Disposition: A | Discharge status: Home / Self Care | Payer: Medicare Other | Source: Ambulatory Visit | Attending: Orthopedic Surgery | Admitting: Orthopedic Surgery

## 2022-07-15 DIAGNOSIS — Z87891 Personal history of nicotine dependence: Secondary | ICD-10-CM | POA: Diagnosis not present

## 2022-07-15 DIAGNOSIS — Z79899 Other long term (current) drug therapy: Secondary | ICD-10-CM | POA: Insufficient documentation

## 2022-07-15 DIAGNOSIS — E119 Type 2 diabetes mellitus without complications: Secondary | ICD-10-CM

## 2022-07-15 DIAGNOSIS — Z7984 Long term (current) use of oral hypoglycemic drugs: Secondary | ICD-10-CM

## 2022-07-15 DIAGNOSIS — Z01818 Encounter for other preprocedural examination: Secondary | ICD-10-CM

## 2022-07-15 DIAGNOSIS — I10 Essential (primary) hypertension: Secondary | ICD-10-CM

## 2022-07-15 DIAGNOSIS — M1611 Unilateral primary osteoarthritis, right hip: Principal | ICD-10-CM | POA: Diagnosis present

## 2022-07-15 DIAGNOSIS — Z96651 Presence of right artificial knee joint: Secondary | ICD-10-CM | POA: Diagnosis not present

## 2022-07-15 DIAGNOSIS — M169 Osteoarthritis of hip, unspecified: Secondary | ICD-10-CM | POA: Diagnosis present

## 2022-07-15 DIAGNOSIS — Z96642 Presence of left artificial hip joint: Secondary | ICD-10-CM | POA: Diagnosis not present

## 2022-07-15 DIAGNOSIS — Z8546 Personal history of malignant neoplasm of prostate: Secondary | ICD-10-CM | POA: Insufficient documentation

## 2022-07-15 HISTORY — PX: TOTAL HIP ARTHROPLASTY: SHX124

## 2022-07-15 LAB — ABO/RH: ABO/RH(D): O POS

## 2022-07-15 LAB — GLUCOSE, CAPILLARY
Glucose-Capillary: 145 mg/dL — ABNORMAL HIGH (ref 70–99)
Glucose-Capillary: 128 mg/dL — ABNORMAL HIGH (ref 70–99)

## 2022-07-15 SURGERY — ARTHROPLASTY, HIP, TOTAL, ANTERIOR APPROACH
Anesthesia: Spinal | Site: Hip | Laterality: Right

## 2022-07-15 MED ORDER — BUPIVACAINE-EPINEPHRINE (PF) 0.25% -1:200000 IJ SOLN
INTRAMUSCULAR | Status: DC | PRN
  Administered 2022-07-15: 30 mL

## 2022-07-15 MED ORDER — DOCUSATE SODIUM 100 MG PO CAPS
100.0000 mg | ORAL_CAPSULE | Freq: Two times a day (BID) | ORAL | Status: DC
  Administered 2022-07-15 – 2022-07-16 (×2): 100 mg via ORAL
  Filled 2022-07-15 (×2): qty 1

## 2022-07-15 MED ORDER — FENTANYL CITRATE PF 50 MCG/ML IJ SOSY
25.0000 ug | PREFILLED_SYRINGE | INTRAMUSCULAR | Status: DC | PRN
  Administered 2022-07-15: 50 ug via INTRAVENOUS

## 2022-07-15 MED ORDER — ONDANSETRON HCL 4 MG/2ML IJ SOLN: 4.0000 mg | Freq: Once | INTRAMUSCULAR | Status: DC | PRN

## 2022-07-15 MED ORDER — ONDANSETRON HCL 4 MG/2ML IJ SOLN: 4.0000 mg | Freq: Four times a day (QID) | INTRAMUSCULAR | Status: DC | PRN

## 2022-07-15 MED ORDER — FLEET ENEMA 7-19 GM/118ML RE ENEM: 1.0000 | ENEMA | Freq: Once | RECTAL | Status: DC | PRN

## 2022-07-15 MED ORDER — CEFAZOLIN SODIUM-DEXTROSE 2-4 GM/100ML-% IV SOLN
2.0000 g | INTRAVENOUS | Status: AC
  Administered 2022-07-15: 2 g via INTRAVENOUS
  Filled 2022-07-15: qty 100

## 2022-07-15 MED ORDER — SODIUM CHLORIDE (PF) 0.9 % IJ SOLN
INTRAMUSCULAR | Status: AC
  Filled 2022-07-15: qty 10

## 2022-07-15 MED ORDER — BUPIVACAINE HCL (PF) 0.25 % IJ SOLN
INTRAMUSCULAR | Status: AC
  Filled 2022-07-15: qty 30

## 2022-07-15 MED ORDER — LACTATED RINGERS IV SOLN: INTRAVENOUS | Status: DC

## 2022-07-15 MED ORDER — ONDANSETRON HCL 4 MG PO TABS: 4.0000 mg | ORAL_TABLET | Freq: Four times a day (QID) | ORAL | Status: DC | PRN

## 2022-07-15 MED ORDER — PROPOFOL 500 MG/50ML IV EMUL
INTRAVENOUS | Status: DC | PRN
  Administered 2022-07-15: 100 ug/kg/min via INTRAVENOUS

## 2022-07-15 MED ORDER — EPHEDRINE SULFATE-NACL 50-0.9 MG/10ML-% IV SOSY
PREFILLED_SYRINGE | INTRAVENOUS | Status: DC | PRN
  Administered 2022-07-15: 10 mg via INTRAVENOUS
  Administered 2022-07-15 (×3): 5 mg via INTRAVENOUS

## 2022-07-15 MED ORDER — PHENYLEPHRINE 80 MCG/ML (10ML) SYRINGE FOR IV PUSH (FOR BLOOD PRESSURE SUPPORT)
PREFILLED_SYRINGE | INTRAVENOUS | Status: AC
  Filled 2022-07-15: qty 10

## 2022-07-15 MED ORDER — PROPOFOL 10 MG/ML IV BOLUS
INTRAVENOUS | Status: DC | PRN
  Administered 2022-07-15: 30 mg via INTRAVENOUS
  Administered 2022-07-15: 20 mg via INTRAVENOUS
  Administered 2022-07-15: 30 mg via INTRAVENOUS

## 2022-07-15 MED ORDER — DIPHENHYDRAMINE HCL 12.5 MG/5ML PO ELIX: 12.5000 mg | ORAL_SOLUTION | ORAL | Status: DC | PRN

## 2022-07-15 MED ORDER — FENTANYL CITRATE PF 50 MCG/ML IJ SOSY
PREFILLED_SYRINGE | INTRAMUSCULAR | Status: AC
  Filled 2022-07-15: qty 1

## 2022-07-15 MED ORDER — FENTANYL CITRATE PF 50 MCG/ML IJ SOSY
PREFILLED_SYRINGE | INTRAMUSCULAR | Status: AC
  Administered 2022-07-15: 50 ug via INTRAVENOUS
  Filled 2022-07-15: qty 1

## 2022-07-15 MED ORDER — LOSARTAN POTASSIUM-HCTZ 100-12.5 MG PO TABS: 1.0000 | ORAL_TABLET | Freq: Every day | ORAL | Status: DC

## 2022-07-15 MED ORDER — ATORVASTATIN CALCIUM 10 MG PO TABS
10.0000 mg | ORAL_TABLET | Freq: Every day | ORAL | Status: DC
  Filled 2022-07-15: qty 1

## 2022-07-15 MED ORDER — CHLORHEXIDINE GLUCONATE 0.12 % MT SOLN
15.0000 mL | Freq: Once | OROMUCOSAL | Status: AC
  Administered 2022-07-15: 15 mL via OROMUCOSAL

## 2022-07-15 MED ORDER — METHOCARBAMOL 500 MG IVPB - SIMPLE MED: 500.0000 mg | Freq: Four times a day (QID) | INTRAVENOUS | Status: DC | PRN

## 2022-07-15 MED ORDER — ACETAMINOPHEN 325 MG PO TABS: 325.0000 mg | ORAL_TABLET | Freq: Four times a day (QID) | ORAL | Status: DC | PRN

## 2022-07-15 MED ORDER — FENTANYL CITRATE (PF) 100 MCG/2ML IJ SOLN
INTRAMUSCULAR | Status: DC | PRN
  Administered 2022-07-15 (×2): 50 ug via INTRAVENOUS

## 2022-07-15 MED ORDER — SODIUM CHLORIDE (PF) 0.9 % IJ SOLN
INTRAMUSCULAR | Status: AC
  Filled 2022-07-15: qty 50

## 2022-07-15 MED ORDER — HYDROCHLOROTHIAZIDE 12.5 MG PO TABS
12.5000 mg | ORAL_TABLET | Freq: Every day | ORAL | Status: DC
  Administered 2022-07-16: 12.5 mg via ORAL
  Filled 2022-07-15: qty 1

## 2022-07-15 MED ORDER — PHENYLEPHRINE 80 MCG/ML (10ML) SYRINGE FOR IV PUSH (FOR BLOOD PRESSURE SUPPORT)
PREFILLED_SYRINGE | INTRAVENOUS | Status: DC | PRN
  Administered 2022-07-15: 160 ug via INTRAVENOUS

## 2022-07-15 MED ORDER — SODIUM CHLORIDE 0.9 % IV SOLN: INTRAVENOUS | Status: DC

## 2022-07-15 MED ORDER — BUPIVACAINE LIPOSOME 1.3 % IJ SUSP
INTRAMUSCULAR | Status: AC
  Filled 2022-07-15: qty 20

## 2022-07-15 MED ORDER — WATER FOR IRRIGATION, STERILE IR SOLN
Status: DC | PRN
  Administered 2022-07-15: 2000 mL

## 2022-07-15 MED ORDER — DEXAMETHASONE SODIUM PHOSPHATE 10 MG/ML IJ SOLN
10.0000 mg | Freq: Once | INTRAMUSCULAR | Status: AC
  Administered 2022-07-16: 10 mg via INTRAVENOUS
  Filled 2022-07-15: qty 1

## 2022-07-15 MED ORDER — 0.9 % SODIUM CHLORIDE (POUR BTL) OPTIME
TOPICAL | Status: DC | PRN
  Administered 2022-07-15: 1000 mL

## 2022-07-15 MED ORDER — BUPIVACAINE IN DEXTROSE 0.75-8.25 % IT SOLN
INTRATHECAL | Status: DC | PRN
  Administered 2022-07-15: 1.8 mL via INTRATHECAL

## 2022-07-15 MED ORDER — TRAMADOL HCL 50 MG PO TABS
50.0000 mg | ORAL_TABLET | Freq: Four times a day (QID) | ORAL | Status: DC | PRN
  Administered 2022-07-15: 100 mg via ORAL
  Filled 2022-07-15: qty 2

## 2022-07-15 MED ORDER — PANTOPRAZOLE SODIUM 40 MG PO TBEC
40.0000 mg | DELAYED_RELEASE_TABLET | Freq: Every day | ORAL | Status: DC
  Administered 2022-07-16: 40 mg via ORAL
  Filled 2022-07-15: qty 1

## 2022-07-15 MED ORDER — DEXAMETHASONE SODIUM PHOSPHATE 10 MG/ML IJ SOLN
INTRAMUSCULAR | Status: AC
  Filled 2022-07-15: qty 1

## 2022-07-15 MED ORDER — METOCLOPRAMIDE HCL 5 MG PO TABS: 5.0000 mg | ORAL_TABLET | Freq: Three times a day (TID) | ORAL | Status: DC | PRN

## 2022-07-15 MED ORDER — PREGABALIN 100 MG PO CAPS
100.0000 mg | ORAL_CAPSULE | Freq: Two times a day (BID) | ORAL | Status: DC
  Administered 2022-07-15 – 2022-07-16 (×2): 100 mg via ORAL
  Filled 2022-07-15 (×2): qty 1

## 2022-07-15 MED ORDER — HYDROCODONE-ACETAMINOPHEN 5-325 MG PO TABS
1.0000 | ORAL_TABLET | ORAL | Status: DC | PRN
  Administered 2022-07-15 – 2022-07-16 (×3): 2 via ORAL
  Filled 2022-07-15 (×3): qty 2

## 2022-07-15 MED ORDER — POLYETHYLENE GLYCOL 3350 17 G PO PACK: 17.0000 g | PACK | Freq: Every day | ORAL | Status: DC | PRN

## 2022-07-15 MED ORDER — FENTANYL CITRATE (PF) 100 MCG/2ML IJ SOLN
INTRAMUSCULAR | Status: AC
  Filled 2022-07-15: qty 2

## 2022-07-15 MED ORDER — ONDANSETRON HCL 4 MG/2ML IJ SOLN
INTRAMUSCULAR | Status: DC | PRN
  Administered 2022-07-15: 4 mg via INTRAVENOUS

## 2022-07-15 MED ORDER — METOCLOPRAMIDE HCL 5 MG/ML IJ SOLN: 5.0000 mg | Freq: Three times a day (TID) | INTRAMUSCULAR | Status: DC | PRN

## 2022-07-15 MED ORDER — ORAL CARE MOUTH RINSE: 15.0000 mL | Freq: Once | OROMUCOSAL | Status: AC

## 2022-07-15 MED ORDER — ACETAMINOPHEN 10 MG/ML IV SOLN: 1000.0000 mg | Freq: Once | INTRAVENOUS | Status: DC | PRN

## 2022-07-15 MED ORDER — ONDANSETRON HCL 4 MG/2ML IJ SOLN
INTRAMUSCULAR | Status: AC
  Filled 2022-07-15: qty 2

## 2022-07-15 MED ORDER — ACETAMINOPHEN 10 MG/ML IV SOLN
1000.0000 mg | Freq: Four times a day (QID) | INTRAVENOUS | Status: DC
  Administered 2022-07-15: 1000 mg via INTRAVENOUS
  Filled 2022-07-15: qty 100

## 2022-07-15 MED ORDER — EPHEDRINE 5 MG/ML INJ
INTRAVENOUS | Status: AC
  Filled 2022-07-15: qty 5

## 2022-07-15 MED ORDER — RIVAROXABAN 10 MG PO TABS
10.0000 mg | ORAL_TABLET | Freq: Every day | ORAL | Status: DC
  Administered 2022-07-16: 10 mg via ORAL
  Filled 2022-07-15: qty 1

## 2022-07-15 MED ORDER — AMLODIPINE BESYLATE 5 MG PO TABS
5.0000 mg | ORAL_TABLET | Freq: Every day | ORAL | Status: DC
  Administered 2022-07-16: 5 mg via ORAL
  Filled 2022-07-15: qty 1

## 2022-07-15 MED ORDER — METOPROLOL TARTRATE 50 MG PO TABS
50.0000 mg | ORAL_TABLET | Freq: Two times a day (BID) | ORAL | Status: DC
  Administered 2022-07-15 – 2022-07-16 (×2): 50 mg via ORAL
  Filled 2022-07-15 (×2): qty 1

## 2022-07-15 MED ORDER — METHOCARBAMOL 500 MG PO TABS
500.0000 mg | ORAL_TABLET | Freq: Four times a day (QID) | ORAL | Status: DC | PRN
  Administered 2022-07-16: 500 mg via ORAL
  Filled 2022-07-15 (×2): qty 1

## 2022-07-15 MED ORDER — LOSARTAN POTASSIUM 50 MG PO TABS
100.0000 mg | ORAL_TABLET | Freq: Every day | ORAL | Status: DC
  Administered 2022-07-16: 100 mg via ORAL
  Filled 2022-07-15: qty 2

## 2022-07-15 MED ORDER — MORPHINE SULFATE (PF) 2 MG/ML IV SOLN: 1.0000 mg | INTRAVENOUS | Status: DC | PRN

## 2022-07-15 MED ORDER — CEFAZOLIN SODIUM-DEXTROSE 2-4 GM/100ML-% IV SOLN
2.0000 g | Freq: Four times a day (QID) | INTRAVENOUS | Status: AC
  Administered 2022-07-15 – 2022-07-16 (×2): 2 g via INTRAVENOUS
  Filled 2022-07-15 (×2): qty 100

## 2022-07-15 MED ORDER — MENTHOL 3 MG MT LOZG: 1.0000 | LOZENGE | OROMUCOSAL | Status: DC | PRN

## 2022-07-15 MED ORDER — METHOCARBAMOL 500 MG IVPB - SIMPLE MED
INTRAVENOUS | Status: AC
  Administered 2022-07-15: 500 mg via INTRAVENOUS
  Filled 2022-07-15: qty 55

## 2022-07-15 MED ORDER — LIDOCAINE HCL (PF) 2 % IJ SOLN
INTRAMUSCULAR | Status: AC
  Filled 2022-07-15: qty 5

## 2022-07-15 MED ORDER — EPINEPHRINE PF 1 MG/ML IJ SOLN
INTRAMUSCULAR | Status: AC
  Filled 2022-07-15: qty 1

## 2022-07-15 MED ORDER — DEXAMETHASONE SODIUM PHOSPHATE 10 MG/ML IJ SOLN
8.0000 mg | Freq: Once | INTRAMUSCULAR | Status: AC
  Administered 2022-07-15: 8 mg via INTRAVENOUS

## 2022-07-15 MED ORDER — TAMSULOSIN HCL 0.4 MG PO CAPS: 0.4000 mg | ORAL_CAPSULE | Freq: Every day | ORAL | Status: DC

## 2022-07-15 MED ORDER — ARTIFICIAL TEARS OPHTHALMIC OINT
TOPICAL_OINTMENT | OPHTHALMIC | Status: AC
  Filled 2022-07-15: qty 3.5

## 2022-07-15 MED ORDER — TRANEXAMIC ACID-NACL 1000-0.7 MG/100ML-% IV SOLN
1000.0000 mg | INTRAVENOUS | Status: AC
  Administered 2022-07-15: 1000 mg via INTRAVENOUS
  Filled 2022-07-15: qty 100

## 2022-07-15 MED ORDER — POVIDONE-IODINE 10 % EX SWAB
2.0000 | Freq: Once | CUTANEOUS | Status: AC
  Administered 2022-07-15: 2 via TOPICAL

## 2022-07-15 MED ORDER — BISACODYL 10 MG RE SUPP: 10.0000 mg | Freq: Every day | RECTAL | Status: DC | PRN

## 2022-07-15 MED ORDER — PHENOL 1.4 % MT LIQD: 1.0000 | OROMUCOSAL | Status: DC | PRN

## 2022-07-15 SURGICAL SUPPLY — 46 items
ARTICULEZE HEAD (Hips) ×1 IMPLANT
BAG COUNTER SPONGE SURGICOUNT (BAG) IMPLANT
BAG DECANTER FOR FLEXI CONT (MISCELLANEOUS) IMPLANT
BAG ZIPLOCK 12X15 (MISCELLANEOUS) IMPLANT
BLADE SAG 18X100X1.27 (BLADE) ×1 IMPLANT
CLSR STERI-STRIP ANTIMIC 1/2X4 (GAUZE/BANDAGES/DRESSINGS) IMPLANT
COVER PERINEAL POST (MISCELLANEOUS) ×1 IMPLANT
COVER SURGICAL LIGHT HANDLE (MISCELLANEOUS) ×1 IMPLANT
CUP ACETBLR 54 OD PINNACLE (Hips) IMPLANT
DRAPE FOOT SWITCH (DRAPES) ×1 IMPLANT
DRAPE STERI IOBAN 125X83 (DRAPES) ×1 IMPLANT
DRAPE U-SHAPE 47X51 STRL (DRAPES) ×2 IMPLANT
DRESSING AQUACEL AG SP 3.5X10 (GAUZE/BANDAGES/DRESSINGS) IMPLANT
DRSG AQUACEL AG ADV 3.5X10 (GAUZE/BANDAGES/DRESSINGS) ×1 IMPLANT
DRSG AQUACEL AG SP 3.5X10 (GAUZE/BANDAGES/DRESSINGS) ×1
DURAPREP 26ML APPLICATOR (WOUND CARE) ×1 IMPLANT
ELECT REM PT RETURN 15FT ADLT (MISCELLANEOUS) ×1 IMPLANT
GLOVE BIO SURGEON STRL SZ 6.5 (GLOVE) IMPLANT
GLOVE BIO SURGEON STRL SZ7.5 (GLOVE) IMPLANT
GLOVE BIO SURGEON STRL SZ8 (GLOVE) ×1 IMPLANT
GLOVE BIOGEL PI IND STRL 6.5 (GLOVE) IMPLANT
GLOVE BIOGEL PI IND STRL 7.0 (GLOVE) IMPLANT
GLOVE BIOGEL PI IND STRL 8 (GLOVE) ×1 IMPLANT
GOWN STRL REUS W/ TWL LRG LVL3 (GOWN DISPOSABLE) ×1 IMPLANT
GOWN STRL REUS W/ TWL XL LVL3 (GOWN DISPOSABLE) IMPLANT
GOWN STRL REUS W/TWL LRG LVL3 (GOWN DISPOSABLE) ×1
GOWN STRL REUS W/TWL XL LVL3 (GOWN DISPOSABLE) ×1
HEAD ARTICULEZE (Hips) IMPLANT
HOLDER FOLEY CATH W/STRAP (MISCELLANEOUS) ×1 IMPLANT
KIT TURNOVER KIT A (KITS) IMPLANT
LINER MARATHON NEUT +4X54X36 (Hips) IMPLANT
MANIFOLD NEPTUNE II (INSTRUMENTS) ×1 IMPLANT
PACK ANTERIOR HIP CUSTOM (KITS) ×1 IMPLANT
PENCIL SMOKE EVACUATOR COATED (MISCELLANEOUS) ×1 IMPLANT
SLEEVE SURGEON STRL (DRAPES) IMPLANT
SPIKE FLUID TRANSFER (MISCELLANEOUS) ×1 IMPLANT
STEM FEM ACTIS HIGH SZ7 (Stem) IMPLANT
STRIP CLOSURE SKIN 1/2X4 (GAUZE/BANDAGES/DRESSINGS) ×1 IMPLANT
SUT ETHIBOND NAB CT1 #1 30IN (SUTURE) ×1 IMPLANT
SUT MNCRL AB 4-0 PS2 18 (SUTURE) ×1 IMPLANT
SUT STRATAFIX 0 PDS 27 VIOLET (SUTURE) ×1
SUT VIC AB 2-0 CT1 27 (SUTURE) ×2
SUT VIC AB 2-0 CT1 TAPERPNT 27 (SUTURE) ×2 IMPLANT
SUTURE STRATFX 0 PDS 27 VIOLET (SUTURE) ×1 IMPLANT
TRAY FOLEY MTR SLVR 16FR STAT (SET/KITS/TRAYS/PACK) ×1 IMPLANT
TUBE SUCTION HIGH CAP CLEAR NV (SUCTIONS) ×1 IMPLANT

## 2022-07-15 NOTE — Anesthesia Postprocedure Evaluation (Signed)
Anesthesia Post Note  Patient: Alex Randolph  Procedure(s) Performed: TOTAL HIP ARTHROPLASTY ANTERIOR APPROACH (Right: Hip)     Patient location during evaluation: Nursing Unit Anesthesia Type: Spinal Level of consciousness: oriented and awake and alert Pain management: pain level controlled Vital Signs Assessment: post-procedure vital signs reviewed and stable Respiratory status: spontaneous breathing and respiratory function stable Cardiovascular status: blood pressure returned to baseline and stable Postop Assessment: no headache, no backache, no apparent nausea or vomiting and patient able to bend at knees Anesthetic complications: no  No notable events documented.  Last Vitals:  Vitals:   07/15/22 1545 07/15/22 1556  BP: (!) 151/74 (!) 159/74  Pulse: (!) 56 (!) 56  Resp: 10 16  Temp:  36.6 C  SpO2: 100% 99%    Last Pain:  Vitals:   07/15/22 1545  TempSrc:   PainSc: 3                  Barnet Glasgow

## 2022-07-15 NOTE — Anesthesia Procedure Notes (Addendum)
Procedure Name: MAC Date/Time: 07/15/2022 1:29 PM  Performed by: Jenne Campus, CRNAPre-anesthesia Checklist: Patient identified, Emergency Drugs available, Suction available and Patient being monitored Patient Re-evaluated:Patient Re-evaluated prior to induction Oxygen Delivery Method: Simple face mask Placement Confirmation: positive ETCO2

## 2022-07-15 NOTE — Op Note (Signed)
OPERATIVE REPORT- TOTAL HIP ARTHROPLASTY   PREOPERATIVE DIAGNOSIS: Osteoarthritis of the Right hip.   POSTOPERATIVE DIAGNOSIS: Osteoarthritis of the Right  hip.   PROCEDURE: Right total hip arthroplasty, anterior approach.   SURGEON: Gaynelle Arabian, MD   ASSISTANT: Molli Barrows, PA-C  ANESTHESIA:  Spinal  ESTIMATED BLOOD LOSS:-200 mL    DRAINS: None  COMPLICATIONS: None   CONDITION: PACU - hemodynamically stable.   BRIEF CLINICAL NOTE: Alex Randolph is a 77 y.o. male who has advanced end-  stage arthritis of their Right  hip with progressively worsening pain and  dysfunction.The patient has failed nonoperative management and presents for  total hip arthroplasty.   PROCEDURE IN DETAIL: After successful administration of spinal  anesthetic, the traction boots for the [facility] bed were placed on both  feet and the patient was placed onto the [facility] bed, boots placed into the leg  holders. The Right hip was then isolated from the perineum with plastic  drapes and prepped and draped in the usual sterile fashion. ASIS and  greater trochanter were marked and a oblique incision was made, starting  at about 1 cm lateral and 2 cm distal to the ASIS and coursing towards  the anterior cortex of the femur. The skin was cut with a 10 blade  through subcutaneous tissue to the level of the fascia overlying the  tensor fascia lata muscle. The fascia was then incised in line with the  incision at the junction of the anterior third and posterior 2/3rd. The  muscle was teased off the fascia and then the interval between the TFL  and the rectus was developed. The Hohmann retractor was then placed at  the top of the femoral neck over the capsule. The vessels overlying the  capsule were cauterized and the fat on top of the capsule was removed.  A Hohmann retractor was then placed anterior underneath the rectus  femoris to give exposure to the entire anterior capsule. A T-shaped  capsulotomy was  performed. The edges were tagged and the femoral head  was identified.       Osteophytes are removed off the superior acetabulum.  The femoral neck was then cut in situ with an oscillating saw. Traction  was then applied to the left lower extremity utilizing the [facility]  traction. The femoral head was then removed. Retractors were placed  around the acetabulum and then circumferential removal of the labrum was  performed. Osteophytes were also removed. Reaming starts at 51 mm to  medialize and  Increased in 2 mm increments to 53 mm. We reamed in  approximately 40 degrees of abduction, 20 degrees anteversion. A 54 mm  pinnacle acetabular shell was then impacted in anatomic position under  fluoroscopic guidance with excellent purchase. We did not need to place  any additional dome screws. A 36 mm neutral + 4 marathon liner was then  placed into the acetabular shell.       The femoral lift was then placed along the lateral aspect of the femur  just distal to the vastus ridge. The leg was  externally rotated and capsule  was stripped off the inferior aspect of the femoral neck down to the  level of the lesser trochanter, this was done with electrocautery. The femur was lifted after this was performed. The  leg was then placed in an extended and adducted position essentially delivering the femur. We also removed the capsule superiorly and the piriformis from the piriformis fossa to gain  excellent exposure of the  proximal femur. Rongeur was used to remove some cancellous bone to get  into the lateral portion of the proximal femur for placement of the  initial starter reamer. The starter broaches was placed  the starter broach  and was shown to go down the center of the canal. Broaching  with the Actis system was then performed starting at size 0  coursing  Up to size 7. A size 7 had excellent torsional and rotational  and axial stability. The trial high offset neck was then placed  with a 36 + 5  trial head. The hip was then reduced. We confirmed that  the stem was in the canal both on AP and lateral x-rays. It also has excellent sizing. The hip was reduced with outstanding stability through full extension and full external rotation.. AP pelvis was taken and the leg lengths were measured and found to be equal. Hip was then dislocated again and the femoral head and neck removed. The  femoral broach was removed. Size 7 Actis stem with a high offset  neck was then impacted into the femur following native anteversion. Has  excellent purchase in the canal. Excellent torsional and rotational and  axial stability. It is confirmed to be in the canal on AP and lateral  fluoroscopic views. The 36 + 5 metal head was placed and the hip  reduced with outstanding stability. Again AP pelvis was taken and it  confirmed that the leg lengths were equal. The wound was then copiously  irrigated with saline solution and the capsule reattached and repaired  with Ethibond suture. 30 ml of .25% Bupivicaine was  injected into the capsule and into the edge of the tensor fascia lata as well as subcutaneous tissue. The fascia overlying the tensor fascia lata was then closed with a running #1 V-Loc. Subcu was closed with interrupted 2-0 Vicryl and subcuticular running 4-0 Monocryl. Incision was cleaned  and dried. Steri-Strips and a bulky sterile dressing applied. The patient was awakened and transported to  recovery in stable condition.        Please note that a surgical assistant was a medical necessity for this procedure to perform it in a safe and expeditious manner. Assistant was necessary to provide appropriate retraction of vital neurovascular structures and to prevent femoral fracture and allow for anatomic placement of the prosthesis.  Gaynelle Arabian, M.D.

## 2022-07-15 NOTE — Plan of Care (Signed)
  Problem: Education: Goal: Knowledge of the prescribed therapeutic regimen will improve Outcome: Progressing Goal: Understanding of discharge needs will improve Outcome: Progressing   Problem: Pain Management: Goal: Pain level will decrease with appropriate interventions Outcome: Progressing   

## 2022-07-15 NOTE — Interval H&P Note (Signed)
History and Physical Interval Note:  07/15/2022 11:28 AM  Alex Randolph  has presented today for surgery, with the diagnosis of right hip osteoarthritis.  The various methods of treatment have been discussed with the patient and family. After consideration of risks, benefits and other options for treatment, the patient has consented to  Procedure(s): TOTAL HIP ARTHROPLASTY ANTERIOR APPROACH (Right) as a surgical intervention.  The patient's history has been reviewed, patient examined, no change in status, stable for surgery.  I have reviewed the patient's chart and labs.  Questions were answered to the patient's satisfaction.     Pilar Plate Dunia Pringle

## 2022-07-15 NOTE — Transfer of Care (Signed)
Immediate Anesthesia Transfer of Care Note  Patient: Alex Randolph  Procedure(s) Performed: TOTAL HIP ARTHROPLASTY ANTERIOR APPROACH (Right: Hip)  Patient Location: PACU  Anesthesia Type:MAC and Spinal  Level of Consciousness: awake, oriented, and patient cooperative  Airway & Oxygen Therapy: Patient Spontanous Breathing and Patient connected to face mask oxygen  Post-op Assessment: Report given to RN and Post -op Vital signs reviewed and stable  Post vital signs: Reviewed  Last Vitals:  Vitals Value Taken Time  BP 106/58 07/15/22 1447  Temp    Pulse 74 07/15/22 1453  Resp 12 07/15/22 1453  SpO2 90 % 07/15/22 1453  Vitals shown include unvalidated device data.  Last Pain:  Vitals:   07/15/22 1203  TempSrc:   PainSc: 4          Complications: No notable events documented.

## 2022-07-15 NOTE — Discharge Instructions (Addendum)
Information on my medicine - XARELTO (Rivaroxaban)   Why was Xarelto prescribed for you? Xarelto was prescribed for you to reduce the risk of blood clots forming after orthopedic surgery. The medical term for these abnormal blood clots is venous thromboembolism (VTE).  What do you need to know about xarelto ? Take your Xarelto ONCE DAILY at the same time every day. You may take it either with or without food.  If you have difficulty swallowing the tablet whole, you may crush it and mix in applesauce just prior to taking your dose.  Take Xarelto exactly as prescribed by your doctor and DO NOT stop taking Xarelto without talking to the doctor who prescribed the medication.  Stopping without other VTE prevention medication to take the place of Xarelto may increase your risk of developing a clot.  After discharge, you should have regular check-up appointments with your healthcare provider that is prescribing your Xarelto.    What do you do if you miss a dose? If you miss a dose, take it as soon as you remember on the same day then continue your regularly scheduled once daily regimen the next day. Do not take two doses of Xarelto on the same day.   Important Safety Information A possible side effect of Xarelto is bleeding. You should call your healthcare provider right away if you experience any of the following: ? Bleeding from an injury or your nose that does not stop. ? Unusual colored urine (red or dark brown) or unusual colored stools (red or black). ? Unusual bruising for unknown reasons. ? A serious fall or if you hit your head (even if there is no bleeding).  Some medicines may interact with Xarelto and might increase your risk of bleeding while on Xarelto. To help avoid this, consult your healthcare provider or pharmacist prior to using any new prescription or non-prescription medications, including herbals, vitamins, non-steroidal anti-inflammatory drugs (NSAIDs) and  supplements.  This website has more information on Xarelto: www.xarelto.com.     than one stool per week.  One of the most common issues patients have following surgery is constipation.  Even if you have a regular bowel pattern at home, your normal regimen is likely to be disrupted due to multiple reasons following surgery.  Combination of anesthesia, postoperative narcotics, change in appetite and fluid intake all can  affect your bowels.  In order to avoid complications following surgery, here are some recommendations in order to help you during your recovery period.  Colace (docusate) - Pick up an over-the-counter form of Colace or another stool softener and take twice a day as long as you are requiring postoperative pain medications.  Take with a full glass of water daily.  If you experience loose stools or diarrhea, hold the colace until you stool forms back up.  If your symptoms do not get better within 1 week or if they get worse, check with your doctor. Dulcolax (bisacodyl) - Pick up over-the-counter and take as directed by the product packaging as needed to assist with the movement of your bowels.  Take with a full glass of water.  Use this product as needed if not relieved by Colace only.  MiraLax (polyethylene glycol) - Pick up over-the-counter to have on hand.  MiraLax is a solution that will increase the amount of water in your bowels to assist with bowel movements.  Take as directed and can mix with a glass of water, juice, soda, coffee, or tea.  Take if you go more than two days without a movement.Do not use MiraLax more than once per day. Call your doctor if you are still constipated or irregular after using this medication for 7 days in a row.  If you continue to have problems with postoperative constipation, please contact the office for further assistance and recommendations.  If you experience "the worst abdominal pain ever" or develop nausea or vomiting, please contact the office immediatly for further recommendations for treatment.  ITCHING  If you experience itching with your medications, try taking only a single pain pill, or even half a pain pill at a time.  You can also use Benadryl over the counter for itching or also to help with sleep.   MEDICATIONS See your medication summary on the "After Visit Summary" that the nursing staff will review with you prior to discharge.  You may have some home  medications which will be placed on hold until you complete the course of blood thinner medication.  It is important for you to complete the blood thinner medication as prescribed by your surgeon.  Continue your approved medications as instructed at time of discharge.  PRECAUTIONS If you experience chest pain or shortness of breath - call 911 immediately for transfer to the hospital emergency department.  If you develop a fever greater that 101 F, purulent drainage from wound, increased redness or drainage from wound, foul odor from the wound/dressing, or calf pain - CONTACT YOUR SURGEON.                                                   FOLLOW-UP APPOINTMENTS Make sure you keep all of your appointments after your operation with your surgeon and caregivers. You should call the office at the above phone number and make an appointment for approximately two weeks after the date of your surgery or on the date  instructed by your surgeon outlined in the "After Visit Summary".  RANGE OF MOTION AND STRENGTHENING EXERCISES  These exercises are designed to help you keep full movement of your hip joint. Follow your caregiver's or physical therapist's instructions. Perform all exercises about fifteen times, three times per day or as directed. Exercise both hips, even if you have had only one joint replacement. These exercises can be done on a training (exercise) mat, on the floor, on a table or on a bed. Use whatever works the best and is most comfortable for you. Use music or television while you are exercising so that the exercises are a pleasant break in your day. This will make your life better with the exercises acting as a break in routine you can look forward to.  Lying on your back, slowly slide your foot toward your buttocks, raising your knee up off the floor. Then slowly slide your foot back down until your leg is straight again.  Lying on your back spread your legs as far apart as you can without causing  discomfort.  Lying on your side, raise your upper leg and foot straight up from the floor as far as is comfortable. Slowly lower the leg and repeat.  Lying on your back, tighten up the muscle in the front of your thigh (quadriceps muscles). You can do this by keeping your leg straight and trying to raise your heel off the floor. This helps strengthen the largest muscle supporting your knee.  Lying on your back, tighten up the muscles of your buttocks both with the legs straight and with the knee bent at a comfortable angle while keeping your heel on the floor.   POST-OPERATIVE OPIOID TAPER INSTRUCTIONS: It is important to wean off of your opioid medication as soon as possible. If you do not need pain medication after your surgery it is ok to stop day one. Opioids include: Codeine, Hydrocodone(Norco, Vicodin), Oxycodone(Percocet, oxycontin) and hydromorphone amongst others.  Long term and even short term use of opiods can cause: Increased pain response Dependence Constipation Depression Respiratory depression And more.  Withdrawal symptoms can include Flu like symptoms Nausea, vomiting And more Techniques to manage these symptoms Hydrate well Eat regular healthy meals Stay active Use relaxation techniques(deep breathing, meditating, yoga) Do Not substitute Alcohol to help with tapering If you have been on opioids for less than two weeks and do not have pain than it is ok to stop all together.  Plan to wean off of opioids This plan should start within one week post op of your joint replacement. Maintain the same interval or time between taking each dose and first decrease the dose.  Cut the total daily intake of opioids by one tablet each day Next start to increase the time between doses. The last dose that should be eliminated is the evening dose.   IF YOU ARE TRANSFERRED TO A SKILLED REHAB FACILITY If the patient is transferred to a skilled rehab facility following release from the  hospital, a list of the current medications will be sent to the facility for the patient to continue.  When discharged from the skilled rehab facility, please have the facility set up the patient's [place] prior to being released. Also, the skilled facility will be responsible for providing the patient with their medications at time of release from the facility to include their pain medication, the muscle relaxants, and their blood thinner medication. If the patient is still at the rehab facility at  time of the two week follow up appointment, the skilled rehab facility will also need to assist the patient in arranging follow up appointment in our office and any transportation needs.  MAKE SURE YOU:  Understand these instructions.  Get help right away if you are not doing well or get worse.   DENTAL ANTIBIOTICS:  In most cases prophylactic antibiotics for Dental procdeures after total joint surgery are not necessary.  Exceptions are as follows:  1. History of prior total joint infection  2. Severely immunocompromised (Organ Transplant, cancer chemotherapy, Rheumatoid biologic meds such as Mignon)  3. Poorly controlled diabetes (A1C &gt; 8.0, blood glucose over 200)  If you have one of these conditions, contact your surgeon for an antibiotic prescription, prior to your dental procedure.    Pick up stool softner and laxative for home use following surgery while on pain medications. Do not submerge incision under water. Please use good hand washing techniques while changing dressing each day. May shower starting three days after surgery. Please use a clean towel to pat the incision dry following showers. Continue to use ice for pain and swelling after surgery. Do not use any lotions or creams on the incision until instructed by your surgeon.  Information on my medicine - XARELTO (Rivaroxaban)  This medication education was reviewed with me or my healthcare  representative as part of my discharge preparation.  The pharmacist that spoke with me during my hospital stay was:    Why was Xarelto prescribed for you? Xarelto was prescribed for you to reduce the risk of blood clots forming after orthopedic surgery. The medical term for these abnormal blood clots is venous thromboembolism (VTE).  What do you need to know about xarelto ? Take your Xarelto ONCE DAILY at the same time every day. You may take it either with or without food.  If you have difficulty swallowing the tablet whole, you may crush it and mix in applesauce just prior to taking your dose.  Take Xarelto exactly as prescribed by your doctor and DO NOT stop taking Xarelto without talking to the doctor who prescribed the medication.  Stopping without other VTE prevention medication to take the place of Xarelto may increase your risk of developing a clot.  After discharge, you should have regular check-up appointments with your healthcare provider that is prescribing your Xarelto.    What do you do if you miss a dose? If you miss a dose, take it as soon as you remember on the same day then continue your regularly scheduled once daily regimen the next day. Do not take two doses of Xarelto on the same day.   Important Safety Information A possible side effect of Xarelto is bleeding. You should call your healthcare provider right away if you experience any of the following: Bleeding from an injury or your nose that does not stop. Unusual colored urine (red or dark brown) or unusual colored stools (red or black). Unusual bruising for unknown reasons. A serious fall or if you hit your head (even if there is no bleeding).  Some medicines may interact with Xarelto and might increase your risk of bleeding while on Xarelto. To help avoid this, consult your healthcare provider or pharmacist prior to using any new prescription or non-prescription medications, including herbals, vitamins,  non-steroidal anti-inflammatory drugs (NSAIDs) and supplements.  This website has more information on Xarelto: [email or web address]

## 2022-07-15 NOTE — Anesthesia Procedure Notes (Signed)
Spinal  Patient location during procedure: OR Start time: 07/15/2022 1:16 PM End time: 07/15/2022 1:21 PM Reason for block: surgical anesthesia Staffing Performed: resident/CRNA  Resident/CRNA: Jenne Campus, CRNA Performed by: Jenne Campus, CRNA Authorized by: Barnet Glasgow, MD   Preanesthetic Checklist Completed: patient identified, IV checked, site marked, risks and benefits discussed, surgical consent, monitors and equipment checked, pre-op evaluation and timeout performed Spinal Block Patient position: sitting Prep: DuraPrep Patient monitoring: heart rate, cardiac monitor, continuous pulse ox and blood pressure Approach: midline Location: L3-4 Injection technique: single-shot Needle Needle type: Pencan  Needle gauge: 24 G Needle length: 10 cm Assessment Sensory level: T6 Events: CSF return Additional Notes IV functioning, monitors applied to pt. Expiration date of kit checked and confirmed to be in date.  Sterile prep and drape, including hand hygiene, mask and sterile gloves were used. The patient was positioned and the spine was prepped. Skin was anesthetized with lidocaine. Free flow of clear CSF obtained prior to injecting local anesthetic into the CSF. Spinal needle aspirated freely following injection. Needle was carefully withdrawn, and pt tolerated procedure well. Loss of motor and sensory on exam post injection.

## 2022-07-15 NOTE — Evaluation (Signed)
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 Pt has PMH including but not limited to DM II, prostate Ca, HTN, L THA (2018) and R TKA (2019).  Clinical Impression   Alex Randolph is a 77 y.o. male POD 0 s/p R THA anterior approach. Patient reports mod I using B crutches with mobility at baseline. Patient is now limited by functional impairments (see PT problem list below) and requires min A for bed mobility and min A for transfers. Patient was able to ambulate 52 feet with RW and min guard level of assist. Patient instructed in exercise to facilitate ROM and circulation to manage edema. Patient will benefit from continued skilled PT interventions to address impairments and progress towards PLOF. Acute PT will follow to progress mobility and stair training in preparation for safe discharge home with HEP.       Recommendations for follow up therapy are one component of a multi-disciplinary discharge planning process, led by the attending physician.  Recommendations may be updated based on patient status, additional functional criteria and insurance authorization.  Follow Up Recommendations Follow physician's recommendations for discharge plan and follow up therapies      Assistance Recommended at Discharge Intermittent Supervision/Assistance  Patient can return home with the following  A little help with walking and/or transfers;A little help with bathing/dressing/bathroom;Assist for transportation;Help with stairs or ramp for entrance;Assistance with cooking/housework    Equipment Recommendations Rolling walker (2 wheels)  Recommendations for Other Services       Functional Status Assessment Patient has had a recent decline in their functional status and demonstrates the ability to make significant  improvements in function in a reasonable and predictable amount of time.     Precautions / Restrictions Precautions Precautions: Anterior Hip;Fall Restrictions Weight Bearing Restrictions: No      Mobility  Bed Mobility Overal bed mobility: Needs Assistance Bed Mobility: Supine to Sit     Supine to sit: Min assist     General bed mobility comments: increased I initial assist with RLE and use of bed rails with HOB elevated    Transfers Overall transfer level: Needs assistance Equipment used: Rolling walker (2 wheels) Transfers: Sit to/from Stand Sit to Stand: Min guard           General transfer comment: cues for proper UE placement    Ambulation/Gait Ambulation/Gait assistance: Min guard Gait Distance (Feet): 52 Feet Assistive device: Rolling walker (2 wheels) Gait Pattern/deviations: Step-to pattern, Antalgic, Trunk flexed Gait velocity: decreased        Stairs            Wheelchair Mobility    Modified Rankin (Stroke Patients Only)       Balance Overall balance assessment: Needs assistance Sitting-balance support: Feet supported Sitting balance-Leahy Scale: Fair     Standing balance support: Reliant on assistive device for balance Standing balance-Leahy Scale: Poor                               Pertinent Vitals/Pain Pain Assessment Pain Assessment: 0-10 Pain Score: 8  Pain Location: R hip and radiating to knee Pain Descriptors / Indicators: Guarding, Discomfort, Radiating Pain Intervention(s): Limited activity within patient's tolerance, Monitored during session, Repositioned, Premedicated before session, Ice applied    Home Living Family/patient  expects to be discharged to:: Private residence Living Arrangements: Spouse/significant other Available Help at Discharge: Family Type of Home: House Home Access: Level entry       [place]: Two level;Able to live on main level with bedroom/bathroom Home Equipment:  Shower seat;Cane - single point;Crutches      Prior Function Prior Level of Function : Independent/Modified Independent               ADLs Comments: pt reports mod I with ADLs, self care tasks, IADls, driving and B crutches in home and community  pt indicates limiting community navigation prior to sx     Hand Dominance        Extremity/Trunk Assessment        Lower Extremity Assessment Lower Extremity Assessment: RLE deficits/detail RLE Deficits / Details: R ankle DF 4/5 and PF 5/5 RLE Sensation: decreased light touch;history of peripheral neuropathy       Communication   Communication: No difficulties  Cognition Arousal/Alertness: Awake/alert Behavior During Therapy: WFL for tasks assessed/performed Overall Cognitive Status: Within Functional Limits for tasks assessed                                          General Comments      Exercises Total Joint Exercises Ankle Circles/Pumps: AROM, Both, 20 reps   Assessment/Plan    PT Assessment Patient needs continued PT services  PT Problem List Decreased strength;Decreased range of motion;Decreased mobility;Decreased balance;Decreased activity tolerance;Decreased knowledge of use of DME;Pain       PT Treatment Interventions DME instruction;Gait training;Functional mobility training;Therapeutic activities;Therapeutic exercise;Balance training;Neuromuscular re-education;Patient/family education;Modalities    PT Goals (Current goals can be found in the Care Plan section)  Acute Rehab PT Goals Patient Stated Goal: to move more easily PT Goal Formulation: With patient Time For Goal Achievement: 07/29/22 Potential to Achieve Goals: Good    Frequency 7X/week     Co-evaluation               AM-PAC PT "6 Clicks" Mobility  Outcome Measure Help needed turning from your back to your side while in a flat bed without using bedrails?: A Little Help needed moving from lying on your back to  sitting on the side of a flat bed without using bedrails?: A Little Help needed moving to and from a bed to a chair (including a wheelchair)?: A Little Help needed standing up from a chair using your arms (e.g., wheelchair or bedside chair)?: A Little Help needed to walk in hospital room?: A Little Help needed climbing 3-5 steps with a railing? : Total 6 Click Score: 16    End of Session Equipment Utilized During Treatment: Gait belt Activity Tolerance: No increased pain;Patient limited by fatigue Patient left: in chair;with call bell/phone within reach;with family/visitor present Nurse Communication: Mobility status PT Visit Diagnosis: Unsteadiness on feet (R26.81);Other abnormalities of gait and mobility (R26.89);Muscle weakness (generalized) (M62.81);Difficulty in walking, not elsewhere classified (R26.2);Pain Pain - Right/Left: Right Pain - part of body: Hip    Time: 1842 [id number] PT Time Calculation (min) (ACUTE ONLY): 39 min   Charges:   PT Evaluation $PT Eval Low Complexity: 1 Low PT Treatments $Gait Training: 8-22 mins $Therapeutic Activity: 8-22 mins        Baird Lyons, PT   Adair Patter 07/15/2022, 7:25 PM

## 2022-07-16 DIAGNOSIS — M1611 Unilateral primary osteoarthritis, right hip: Secondary | ICD-10-CM | POA: Diagnosis not present

## 2022-07-16 LAB — CBC
HCT: 40.7 % (ref 39.0–52.0)
Hemoglobin: 14.3 g/dL (ref 13.0–17.0)
MCH: 33.5 pg (ref 26.0–34.0)
MCHC: 35.1 g/dL (ref 30.0–36.0)
MCV: 95.3 fL (ref 80.0–100.0)
Platelets: 177 10*3/uL (ref 150–400)
RBC: 4.27 MIL/uL (ref 4.22–5.81)
RDW: 12.4 % (ref 11.5–15.5)
WBC: 11.8 10*3/uL — ABNORMAL HIGH (ref 4.0–10.5)
nRBC: 0 % (ref 0.0–0.2)

## 2022-07-16 LAB — BASIC METABOLIC PANEL
Anion gap: 8 (ref 5–15)
BUN: 15 mg/dL (ref 8–23)
CO2: 26 mmol/L (ref 22–32)
Calcium: 9.2 mg/dL (ref 8.9–10.3)
Chloride: 101 mmol/L (ref 98–111)
Creatinine, Ser: 0.82 mg/dL (ref 0.61–1.24)
GFR, Estimated: >60 mL/min (ref >60)
Glucose, Bld: 169 mg/dL — ABNORMAL HIGH (ref 70–99)
Potassium: 4.1 mmol/L (ref 3.5–5.1)
Sodium: 135 mmol/L (ref 135–145)

## 2022-07-16 MED ORDER — TRAMADOL HCL 50 MG PO TABS: 50.0000 mg | ORAL_TABLET | Freq: Four times a day (QID) | ORAL | 0 refills | Status: AC | PRN

## 2022-07-16 MED ORDER — HYDROCODONE-ACETAMINOPHEN 5-325 MG PO TABS: 1.0000 | ORAL_TABLET | Freq: Four times a day (QID) | ORAL | 0 refills | Status: AC | PRN

## 2022-07-16 MED ORDER — RIVAROXABAN 10 MG PO TABS: 10.0000 mg | ORAL_TABLET | Freq: Every day | ORAL | 0 refills | Status: AC

## 2022-07-16 MED ORDER — METHOCARBAMOL 500 MG PO TABS: 500.0000 mg | ORAL_TABLET | Freq: Four times a day (QID) | ORAL | 0 refills | Status: AC | PRN

## 2022-07-16 NOTE — Plan of Care (Signed)
  Problem: Education: Goal: Knowledge of General Education information will improve Description: Including pain rating scale, medication(s)/side effects and non-pharmacologic comfort measures Outcome: Progressing   Problem: Health Behavior/Discharge Planning: Goal: Ability to manage health-related needs will improve Outcome: Progressing   Problem: Activity: Goal: Risk for activity intolerance will decrease Outcome: Progressing   

## 2022-07-16 NOTE — TOC Transition Note (Signed)
Transition of Care [facility]) - CM/SW Discharge Note  Patient Details  Name: Alex Randolph MRN: [id number] Date of Birth: Jun 23, 1945  [facility] CM/SW Contact:  Sherie Don, LCSW Phone Number: 07/16/2022, 10:14 AM  Clinical Narrative: Patient is expected to discharge home after working with PT. CSW met with patient to confirm discharge plan. Patient will go home with a home exercise program (HEP). Patient will need a rolling walker, which [employer] will deliver to patient's room. TOC signing off.    Final next level of care: Home/Self Care Barriers to Discharge: No Barriers Identified  Patient Goals and CMS Choice CMS Medicare.gov Compare Post Acute Care list provided to:: Patient Choice offered to / list presented to : Patient  Discharge Plan and Services Additional resources added to the After Visit Summary for        DME Arranged: Walker rolling DME Agency: [employer] Representative spoke with at DME Agency: Prearranged in orthopedist's office  Social Determinants of Health ([place]) Interventions SDOH Screenings   Food Insecurity: No Food Insecurity (07/15/2022)  Housing: Low Risk  (07/15/2022)  Transportation Needs: No Transportation Needs (07/15/2022)  Utilities: Not At Risk (07/15/2022)  Tobacco Use: Medium Risk (07/15/2022)   Readmission Risk Interventions     No data to display

## 2022-07-16 NOTE — Progress Notes (Signed)
   Subjective: 1 Day Post-Op Procedure(s) (LRB): TOTAL HIP ARTHROPLASTY ANTERIOR APPROACH (Right) Patient seen in rounds by Dr. Wynelle Link. Patient is well, and has had no acute complaints or problems. Denies SOB or chest pain. Denies calf pain. Foley cath removed this AM. Patient reports pain as mild. Worked with physical therapy yesterday and ambulated 52'. We will continue physical therapy today.   Objective: Vital signs in last 24 hours: Temp:  [97.4 F (36.3 C)-98.4 F (36.9 C)] 98 F (36.7 C) (02/20 0607) Pulse Rate:  [56-95] 64 (02/20 0607) Resp:  [10-19] 16 (02/20 0607) BP: (106-159)/(58-83) 139/67 (02/20 0607) SpO2:  [92 %-100 %] 96 % (02/20 0607) Weight:  [102.5 kg] 102.5 kg (02/19 1302)  Intake/Output from previous day:  Intake/Output Summary (Last 24 hours) at 07/16/2022 0744 Last data filed at 07/16/2022 0610 Gross per 24 hour  Intake 2878.75 ml  Output 3375 ml  Net -496.25 ml     Intake/Output this shift: No intake/output data recorded.  Labs: Recent Labs    07/16/22 0331  HGB 14.3   Recent Labs    07/16/22 0331  WBC 11.8*  RBC 4.27  HCT 40.7  PLT 177   Recent Labs    07/16/22 0331  NA 135  K 4.1  CL 101  CO2 26  BUN 15  CREATININE 0.82  GLUCOSE 169*  CALCIUM 9.2   No results for input(s): "LABPT", "INR" in the last 72 hours.  Exam: General - Patient is Alert and Oriented Extremity - Neurologically intact Neurovascular intact Sensation intact distally Dorsiflexion/Plantar flexion intact Dressing - dressing C/D/I Motor Function - intact, moving foot and toes well on exam.  Past Medical History:  Diagnosis Date   Arthritis 2017   knees,hips,thumbs, neck   BPH (benign prostatic hyperplasia) 2017   radiation   Diabetes mellitus without complication ([place])    Hypertension    controlled with medication   Prostate cancer ([place])    Prostatitis, acute 08/2015   in hospital w/fever-diagnosis made in [place]    Assessment/Plan: 1  Day Post-Op Procedure(s) (LRB): TOTAL HIP ARTHROPLASTY ANTERIOR APPROACH (Right) Principal Problem:   OA (osteoarthritis) of hip Active Problems:   Osteoarthritis of right hip  Estimated body mass index is 31.52 kg/m as calculated from the following:   Height as of this encounter: 5' 11"$  (1.803 m).   Weight as of this encounter: 102.5 kg. Advance diet Up with therapy D/C IV fluids  DVT Prophylaxis - Xarelto Weight bearing as tolerated.  Continue physical therapy. Expected discharge home today pending progress with physical therapy and if meeting patient goals. Will do HEP once discharged. Follow-up in clinic in 2 weeks.   The PDMP database was reviewed today prior to any opioid medications being prescribed to this patient.  R. Jaynie Bream, PA-C Orthopedic Surgery [phone] 07/16/2022, 7:44 AM

## 2022-07-16 NOTE — Discharge Summary (Signed)
Physician Discharge Summary   Patient ID: Alex Randolph MRN: [id number] DOB/AGE: 78/24/1947 78 y.o.  Admit date: 07/15/2022 Discharge date: 07/16/2022  Primary Diagnosis: Osteoarthritis of the right hip.    Admission Diagnoses:  Past Medical History:  Diagnosis Date   Arthritis 2017   knees,hips,thumbs, neck   BPH (benign prostatic hyperplasia) 2017   radiation   Diabetes mellitus without complication ([place])    Hypertension    controlled with medication   Prostate cancer ([place])    Prostatitis, acute 08/2015   in hospital w/fever-diagnosis made in [place]   Discharge Diagnoses:   Principal Problem:   OA (osteoarthritis) of hip Active Problems:   Osteoarthritis of right hip  Estimated body mass index is 31.52 kg/m as calculated from the following:   Height as of this encounter: 5' 11"$  (1.803 m).   Weight as of this encounter: 102.5 kg.  Procedure:  Procedure(s) (LRB): TOTAL HIP ARTHROPLASTY ANTERIOR APPROACH (Right)   Consults: None  HPI: Alex Randolph is a 77 y.o. male who has advanced end-  stage arthritis of their Right  hip with progressively worsening pain and dysfunction.The patient has failed nonoperative management and presents for total hip arthroplasty.   Laboratory Data: Admission on 07/15/2022, Discharged on 07/16/2022  Component Date Value Ref Range Status   ABO/RH(D) 07/15/2022    Final                   Value:O POS Performed at [facility] [address], [place], [place] [address]    Glucose-Capillary 07/15/2022 145 (H)  70 - 99 mg/dL Final   Glucose reference range applies only to samples taken after fasting for at least 8 hours.   Comment 1 07/15/2022 Notify RN   Final   Comment 2 07/15/2022 Document in Chart   Final   Glucose-Capillary 07/15/2022 128 (H)  70 - 99 mg/dL Final   Glucose reference range applies only to samples taken after fasting for at least 8 hours.   WBC 07/16/2022 11.8 (H)  4.0 - 10.5 K/uL Final   RBC  07/16/2022 4.27  4.22 - 5.81 MIL/uL Final   Hemoglobin 07/16/2022 14.3  13.0 - 17.0 g/dL Final   HCT 07/16/2022 40.7  39.0 - 52.0 % Final   MCV 07/16/2022 95.3  80.0 - 100.0 fL Final   MCH 07/16/2022 33.5  26.0 - 34.0 pg Final   MCHC 07/16/2022 35.1  30.0 - 36.0 g/dL Final   RDW 07/16/2022 12.4  11.5 - 15.5 % Final   Platelets 07/16/2022 177  150 - 400 K/uL Final   nRBC 07/16/2022 0.0  0.0 - 0.2 % Final   Performed at [facility], [place] [address] [place], [place] [address]   Sodium 07/16/2022 135  135 - 145 mmol/L Final   Potassium 07/16/2022 4.1  3.5 - 5.1 mmol/L Final   Chloride 07/16/2022 101  98 - 111 mmol/L Final   CO2 07/16/2022 26  22 - 32 mmol/L Final   Glucose, Bld 07/16/2022 169 (H)  70 - 99 mg/dL Final   Glucose reference range applies only to samples taken after fasting for at least 8 hours.   BUN 07/16/2022 15  8 - 23 mg/dL Final   Creatinine, Ser 07/16/2022 0.82  0.61 - 1.24 mg/dL Final   Calcium 07/16/2022 9.2  8.9 - 10.3 mg/dL Final   GFR, Estimated 07/16/2022 >60  >60 mL/min Final   Comment: (NOTE) Calculated using the CKD-EPI Creatinine Equation (2021)  Anion gap 07/16/2022 8  5 - 15 Final   Performed at [facility], [place] [address], [place], [place] [address]  Hospital Outpatient Visit on 07/04/2022  Component Date Value Ref Range Status   Hgb A1c MFr Bld 07/04/2022 6.2 (H)  4.8 - 5.6 % Final   Comment: (NOTE) Pre diabetes:          5.7%-6.4%  Diabetes:              >6.4%  Glycemic control for   <7.0% adults with diabetes    Mean Plasma Glucose 07/04/2022 131.24  mg/dL Final   Performed at [facility], [place] [address], [place], [place] [address]   Sodium 07/04/2022 136  135 - 145 mmol/L Final   Potassium 07/04/2022 4.3  3.5 - 5.1 mmol/L Final   Chloride 07/04/2022 98  98 - 111 mmol/L Final   CO2 07/04/2022 26  22 - 32 mmol/L Final   Glucose, Bld 07/04/2022 136 (H)  70 - 99 mg/dL Final   Glucose reference  range applies only to samples taken after fasting for at least 8 hours.   BUN 07/04/2022 12  8 - 23 mg/dL Final   Creatinine, Ser 07/04/2022 0.78  0.61 - 1.24 mg/dL Final   Calcium 07/04/2022 9.4  8.9 - 10.3 mg/dL Final   GFR, Estimated 07/04/2022 >60  >60 mL/min Final   Comment: (NOTE) Calculated using the CKD-EPI Creatinine Equation (2021)    Anion gap 07/04/2022 12  5 - 15 Final   Performed at [facility], [place] [address], [place], [place] [address]   WBC 07/04/2022 7.8  4.0 - 10.5 K/uL Final   RBC 07/04/2022 4.79  4.22 - 5.81 MIL/uL Final   Hemoglobin 07/04/2022 15.8  13.0 - 17.0 g/dL Final   HCT 07/04/2022 45.9  39.0 - 52.0 % Final   MCV 07/04/2022 95.8  80.0 - 100.0 fL Final   MCH 07/04/2022 33.0  26.0 - 34.0 pg Final   MCHC 07/04/2022 34.4  30.0 - 36.0 g/dL Final   RDW 07/04/2022 12.7  11.5 - 15.5 % Final   Platelets 07/04/2022 145 (L)  150 - 400 K/uL Final   nRBC 07/04/2022 0.0  0.0 - 0.2 % Final   Performed at [facility], [place] [address], [place], [place] [address]   ABO/RH(D) 07/04/2022 O POS   Final   Antibody Screen 07/04/2022 NEG   Final   Sample Expiration 07/04/2022 07/18/2022,2359   Final   Extend sample reason 07/04/2022    Final                   Value:NO TRANSFUSIONS OR PREGNANCY IN THE PAST 3 MONTHS Performed at [facility], [place] [address], [place], [place] [address]    MRSA, PCR 07/04/2022 NEGATIVE  NEGATIVE Final   Staphylococcus aureus 07/04/2022 NEGATIVE  NEGATIVE Final   Comment: (NOTE) The Xpert SA Assay (FDA approved for NASAL specimens in patients 42 years of age and older), is one component of a comprehensive surveillance program. It is not intended to diagnose infection nor to guide or monitor treatment. Performed at [facility], [place] [address] [place], [place] [address]    Glucose-Capillary 07/04/2022 147 (H)  70 - 99 mg/dL Final   Glucose reference range applies only  to samples taken after fasting for at least 8 hours.     X-Rays:DG Pelvis Portable  Result Date: 07/15/2022 CLINICAL DATA:  Status  post right hip arthroplasty. EXAM: PORTABLE PELVIS 1-2 VIEWS COMPARISON:  Intraoperative films with a C-arm earlier today. FINDINGS: Forty projection of the pelvis demonstrates normal alignment a right total hip arthroplasty. No surrounding fracture or abnormal lucency identified. Previously placed left hip arthroplasty. IMPRESSION: Normal alignment status post right total hip arthroplasty. Electronically Signed   By: Aletta Edouard M.D.   On: 07/15/2022 15:12   DG HIP UNILAT WITH PELVIS 1V RIGHT  Result Date: 07/15/2022 CLINICAL DATA:  Total right hip arthroplasty. Intraoperative fluoroscopy. EXAM: DG HIP (WITH OR WITHOUT PELVIS) 1V RIGHT COMPARISON:  CT abdomen and pelvis 07/31/2017 FINDINGS: Images were performed intraoperatively without the presence of a radiologist. Severe superior right femoroacetabular joint space narrowing. Prior partially visualized total left hip arthroplasty. The patient is undergoing new total right hip arthroplasty. No hardware complication is seen. 3 metallic markers overlie the prostate. Total fluoroscopy images: 9 Total fluoroscopy time: 5 seconds Total dose: Radiation Exposure Index (as provided by the fluoroscopic device): 0.906 mGy air Kerma Please see intraoperative findings for further detail. IMPRESSION: Intraoperative fluoroscopy for total right hip arthroplasty. Electronically Signed   By: Yvonne Kendall M.D.   On: 07/15/2022 15:04   DG C-Arm 1-60 Min-No Report  Result Date: 07/15/2022 Fluoroscopy was utilized by the requesting physician.  No radiographic interpretation.   DG C-Arm 1-60 Min-No Report  Result Date: 07/15/2022 Fluoroscopy was utilized by the requesting physician.  No radiographic interpretation.    EKG: Orders placed or performed during the hospital encounter of 07/04/22   EKG 12 lead per protocol   EKG 12 lead  per protocol     Hospital Course: Alex Randolph is a 77 y.o. who was admitted to [facility]. They were brought to the operating room on 07/15/2022 and underwent Procedure(s): [place].  Patient tolerated the procedure well and was later transferred to the recovery room and then to the orthopaedic floor for postoperative care. They were given PO and IV analgesics for pain control following their surgery. They were given 24 hours of postoperative antibiotics of  Anti-infectives (From admission, onward)    Start     Dose/Rate Route Frequency Ordered Stop   07/15/22 2000  ceFAZolin (ANCEF) IVPB 2g/100 mL premix        2 g 200 mL/hr over 30 Minutes Intravenous Every 6 hours 07/15/22 1601 07/16/22 0710   07/15/22 1130  ceFAZolin (ANCEF) IVPB 2g/100 mL premix        2 g 200 mL/hr over 30 Minutes Intravenous On call to O.R. 07/15/22 1124 07/15/22 1330      and started on DVT prophylaxis in the form of Xarelto.   PT and OT were ordered for total joint protocol. Discharge planning consulted to help with postop disposition and equipment needs.  Patient had a good night on the evening of surgery. They started to get up OOB with therapy on POD #0. Pt was seen during rounds and was ready to go home pending progress with therapy. He worked with therapy on POD #1 and was meeting his goals. Pt was discharged to home later that day in stable condition.  Diet: Diabetic diet Activity: WBAT Follow-up: in 2 weeks Disposition: Home Discharged Condition: stable   Discharge Instructions     Call MD / Call 911   Complete by: As directed    If you experience chest pain or shortness of breath, CALL 911 and be transported to the hospital emergency room.  If you develope a  fever above 101 F, pus (white drainage) or increased drainage or redness at the wound, or calf pain, call your surgeon's office.   Change dressing   Complete by: As directed    You have an adhesive  waterproof bandage over the incision. Leave this in place until your first follow-up appointment. Once you remove this you will not need to place another bandage.   Constipation Prevention   Complete by: As directed    Drink plenty of fluids.  Prune juice may be helpful.  You may use a stool softener, such as Colace (over the counter) 100 mg twice a day.  Use MiraLax (over the counter) for constipation as needed.   Diet - low sodium heart healthy   Complete by: As directed    Do not sit on low chairs, stoools or toilet seats, as it may be difficult to get up from low surfaces   Complete by: As directed    Driving restrictions   Complete by: As directed    No driving for two weeks   Post-operative opioid taper instructions:   Complete by: As directed    POST-OPERATIVE OPIOID TAPER INSTRUCTIONS: It is important to wean off of your opioid medication as soon as possible. If you do not need pain medication after your surgery it is ok to stop day one. Opioids include: Codeine, Hydrocodone(Norco, Vicodin), Oxycodone(Percocet, oxycontin) and hydromorphone amongst others.  Long term and even short term use of opiods can cause: Increased pain response Dependence Constipation Depression Respiratory depression And more.  Withdrawal symptoms can include Flu like symptoms Nausea, vomiting And more Techniques to manage these symptoms Hydrate well Eat regular healthy meals Stay active Use relaxation techniques(deep breathing, meditating, yoga) Do Not substitute Alcohol to help with tapering If you have been on opioids for less than two weeks and do not have pain than it is ok to stop all together.  Plan to wean off of opioids This plan should start within one week post op of your joint replacement. Maintain the same interval or time between taking each dose and first decrease the dose.  Cut the total daily intake of opioids by one tablet each day Next start to increase the time between  doses. The last dose that should be eliminated is the evening dose.      TED hose   Complete by: As directed    Use stockings (TED hose) for three weeks on both leg(s).  You may remove them at night for sleeping.   Weight bearing as tolerated   Complete by: As directed       Allergies as of 07/16/2022       Reactions   Penicillins Hives        Medication List     TAKE these medications    amLODipine 5 MG tablet Commonly known as: NORVASC Take 5 mg by mouth daily.   atorvastatin 10 MG tablet Commonly known as: LIPITOR Take 10 mg by mouth daily.   HYDROcodone-acetaminophen 5-325 MG tablet Commonly known as: NORCO/VICODIN Take 1-2 tablets by mouth every 6 (six) hours as needed for severe pain.   lansoprazole 30 MG capsule Commonly known as: PREVACID Take 30 mg by mouth daily at 12 noon.   losartan-hydrochlorothiazide 100-12.5 MG tablet Commonly known as: HYZAAR Take 1 tablet by mouth daily.   metFORMIN 500 MG 24 hr tablet Commonly known as: GLUCOPHAGE-XR Take 500 mg by mouth daily.   methocarbamol 500 MG tablet Commonly known as: ROBAXIN Take 1  tablet (500 mg total) by mouth every 6 (six) hours as needed for muscle spasms.   metoprolol tartrate 50 MG tablet Commonly known as: LOPRESSOR Take 50 mg by mouth 2 (two) times daily.   pregabalin 100 MG capsule Commonly known as: LYRICA Take 100 mg by mouth 2 (two) times daily.   rivaroxaban 10 MG Tabs tablet Commonly known as: XARELTO Take 1 tablet (10 mg total) by mouth daily with breakfast for 20 days. Then take one 81 mg aspirin once a day for three weeks. Then discontinue aspirin.   tamsulosin 0.4 MG Caps capsule Commonly known as: FLOMAX Take 0.4 mg by mouth daily after supper.   traMADol 50 MG tablet Commonly known as: ULTRAM Take 1-2 tablets (50-100 mg total) by mouth every 6 (six) hours as needed for moderate pain.   triamcinolone 0.025 % ointment Commonly known as: KENALOG Apply 1 Application  topically daily as needed (rash).               Discharge Care Instructions  (From admission, onward)           Start     Ordered   07/16/22 0000  Weight bearing as tolerated        07/16/22 0747   07/16/22 0000  Change dressing       Comments: You have an adhesive waterproof bandage over the incision. Leave this in place until your first follow-up appointment. Once you remove this you will not need to place another bandage.   07/16/22 0747            Follow-up Information     Gaynelle Arabian, MD. Schedule an appointment as soon as possible for a visit in 2 week(s).   Specialty: Orthopedic Surgery Contact information: [address] [place] [address] [phone]                 Signed: R. Jaynie Bream, PA-C Orthopedic Surgery 07/16/2022, 7:56 PM

## 2022-07-16 NOTE — Progress Notes (Signed)
Physical Therapy Treatment Patient Details Name: Alex Randolph MRN: [id number] DOB: Dec 17, 1945 Today's Date: 07/16/2022   History of Present Illness 77 yo male presents to therapy s/p R THA on 07/15/2022 due to failure of conservative measures. Pt has PMH including but not limited to DM II, prostate Ca, HTN, L THA (2018) and R TKA (2019).    PT Comments    Pt is s/p R THA anterior approach resulting in the deficits listed below (see PT Problem List). Pt reported decreased pain today 3/10 R hip, mod I with bed mobility, S for transfers and gait tasks of 140 feet with RW and S. PT assigned, reviewed and provided pt with THA HEP HO with pt completing 10 sets of each exercise performing return demonstration. Wife present during tx session. Pt provided with RW and PT adjusted to appropriate height. Pt ed provided on safety, fall risk prevention, car transfer, pain management, importance of follow up with Dr. Wynelle Link in 2 wks and use of RW for stability with gait.  Pt and wife report ready to progress with d/c and nurse made aware.  Pt will benefit from skilled PT to increase their independence and safety with mobility to allow discharge home with HEP.    Recommendations for follow up therapy are one component of a multi-disciplinary discharge planning process, led by the attending physician.  Recommendations may be updated based on patient status, additional functional criteria and insurance authorization.  Follow Up Recommendations  Follow physician's recommendations for discharge plan and follow up therapies     Assistance Recommended at Discharge Intermittent Supervision/Assistance  Patient can return home with the following A little help with walking and/or transfers;A little help with bathing/dressing/bathroom;Assistance with cooking/housework;Assist for transportation;Help with stairs or ramp for entrance   Equipment Recommendations  Rolling walker (2 wheels);Other (comment) (provided during PT tx  and PT adjusted to proper height)    Recommendations for Other Services       Precautions / Restrictions Precautions Precautions: Anterior Hip;Fall Restrictions Weight Bearing Restrictions: No RLE Weight Bearing: Weight bearing as tolerated     Mobility  Bed Mobility Overal bed mobility: Modified Independent Bed Mobility: Supine to Sit     Supine to sit: Modified independent (Device/Increase time)          Transfers Overall transfer level: Needs assistance Equipment used: Rolling walker (2 wheels) Transfers: Sit to/from Stand Sit to Stand: Supervision                Ambulation/Gait Ambulation/Gait assistance: Supervision Gait Distance (Feet): 140 Feet Assistive device: Rolling walker (2 wheels) Gait Pattern/deviations: Step-to pattern, Trunk flexed Gait velocity: decreased         Stairs             Wheelchair Mobility    Modified Rankin (Stroke Patients Only)       Balance Overall balance assessment: Needs assistance Sitting-balance support: Feet supported Sitting balance-Leahy Scale: Fair     Standing balance support: Single extremity supported Standing balance-Leahy Scale: Poor                              Cognition Arousal/Alertness: Awake/alert Behavior During Therapy: WFL for tasks assessed/performed Overall Cognitive Status: Within Functional Limits for tasks assessed  Exercises Total Joint Exercises Ankle Circles/Pumps: AROM, Both, 20 reps Quad Sets: AROM, Right, 10 reps Short Arc Quad: AROM, Right, 10 reps Heel Slides: AROM, Right, 10 reps Hip ABduction/ADduction: AROM, Right, 10 reps Long Arc Quad: AROM, Right, 10 reps Knee Flexion: AROM, Right, 10 reps, Standing Marching in Standing: AROM, Right, 10 reps, Standing Standing Hip Extension: AROM, Right, 10 reps, Standing    General Comments        Pertinent Vitals/Pain Pain Assessment Pain  Assessment: 0-10 Pain Score: 3  Pain Location: R hip Pain Descriptors / Indicators: Aching, Tightness Pain Intervention(s): Limited activity within patient's tolerance, Monitored during session, Patient requesting pain meds-RN notified    Home Living                          Prior Function            PT Goals (current goals can now be found in the care plan section) Acute Rehab PT Goals Patient Stated Goal: to move more easily PT Goal Formulation: With patient Time For Goal Achievement: 07/29/22 Potential to Achieve Goals: Good Progress towards PT goals: Progressing toward goals    Frequency    7X/week      PT Plan Current plan remains appropriate    Co-evaluation              AM-PAC PT "6 Clicks" Mobility   Outcome Measure  Help needed turning from your back to your side while in a flat bed without using bedrails?: None Help needed moving from lying on your back to sitting on the side of a flat bed without using bedrails?: None Help needed moving to and from a bed to a chair (including a wheelchair)?: None Help needed standing up from a chair using your arms (e.g., wheelchair or bedside chair)?: A Little Help needed to walk in hospital room?: A Little Help needed climbing 3-5 steps with a railing? : A Little 6 Click Score: 21    End of Session Equipment Utilized During Treatment: Gait belt Activity Tolerance: Patient tolerated treatment well;No increased pain Patient left: in chair;with call bell/phone within reach;with family/visitor present Nurse Communication: Mobility status;Other (comment);Patient requests pain meds (progression toward d/c) PT Visit Diagnosis: Unsteadiness on feet (R26.81);Muscle weakness (generalized) (M62.81);Difficulty in walking, not elsewhere classified (R26.2);Pain Pain - Right/Left: Right Pain - part of body: Hip     Time: [id number] PT Time Calculation (min) (ACUTE ONLY): 39 min  Charges:  $Gait Training: 8-22  mins $Therapeutic Exercise: 8-22 mins $Therapeutic Activity: 8-22 mins                     Baird Lyons, PT    Adair Patter 07/16/2022, 10:24 AM

## 2022-07-16 NOTE — Progress Notes (Signed)
The patient is alert and oriented and has been seen by his physician. The orders for discharge were written. IV has been removed. Went over discharge instructions with patient and family. He is being discharged via wheelchair with all of his belongings.  

## 2023-03-18 ENCOUNTER — Inpatient Hospital Stay: Payer: Medicare Other | Admitting: Hematology

## 2023-03-18 ENCOUNTER — Other Ambulatory Visit: Payer: Medicare Other

## 2023-03-28 DEATH — deceased
